# Patient Record
Sex: Female | Born: 1995 | Race: Black or African American | Hispanic: No | Smoking: Never smoker
Health system: Southern US, Community
[De-identification: ages and names within clinical notes are randomized; demographics above are authoritative.]

## PROBLEM LIST (undated history)

## (undated) DIAGNOSIS — J302 Other seasonal allergic rhinitis: Secondary | ICD-10-CM

## (undated) DIAGNOSIS — A6000 Herpesviral infection of urogenital system, unspecified: Secondary | ICD-10-CM

## (undated) DIAGNOSIS — F32A Depression, unspecified: Secondary | ICD-10-CM

## (undated) DIAGNOSIS — F329 Major depressive disorder, single episode, unspecified: Secondary | ICD-10-CM

## (undated) DIAGNOSIS — F419 Anxiety disorder, unspecified: Secondary | ICD-10-CM

## (undated) DIAGNOSIS — J45909 Unspecified asthma, uncomplicated: Secondary | ICD-10-CM

## (undated) HISTORY — DX: Other seasonal allergic rhinitis: J30.2

## (undated) HISTORY — DX: Depression, unspecified: F32.A

## (undated) HISTORY — DX: Unspecified asthma, uncomplicated: J45.909

## (undated) HISTORY — DX: Anxiety disorder, unspecified: F41.9

## (undated) HISTORY — DX: Major depressive disorder, single episode, unspecified: F32.9

## (undated) HISTORY — DX: Herpesviral infection of urogenital system, unspecified: A60.00

---
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
## 2017-11-23 ENCOUNTER — Ambulatory Visit (INDEPENDENT_AMBULATORY_CARE_PROVIDER_SITE_OTHER): Payer: Medicaid Other | Admitting: Certified Nurse Midwife

## 2017-11-23 ENCOUNTER — Encounter: Payer: Self-pay | Admitting: Certified Nurse Midwife

## 2017-11-23 VITALS — BP 107/65 | HR 81 | Ht 61.0 in | Wt 118.1 lb

## 2017-11-23 DIAGNOSIS — Z3009 Encounter for other general counseling and advice on contraception: Secondary | ICD-10-CM

## 2017-11-23 DIAGNOSIS — Z708 Other sex counseling: Secondary | ICD-10-CM

## 2017-11-23 NOTE — Progress Notes (Signed)
GYN ENCOUNTER NOTE  Subjective:       Selena Rogers is a 22 y.o. G0P0000 female is here for gynecologic evaluation of the following issues:  1. Follow up for HSV. She states that she was seen jun/july of 2018 for increased vaginal discharge and was treated for BV. In October she was seen for painful vaginal lesions that she found out was HSV.  She was treated with Acyclovir. The lesions went a way after treatment and have not returned. Her mother wanted her to be seen for the HSV to "have it taken care of."   Gynecologic History Patient's last menstrual period was 11/05/2017. Contraception: none Last Pap: N/A  Obstetric History OB History  Gravida Para Term Preterm AB Living  0 0 0 0 0 0  SAB TAB Ectopic Multiple Live Births  0 0 0 0 0        Past Medical History:  Diagnosis Date  . Anxiety   . Asthma   . Depression   . Genital herpes   . Seasonal allergies     History reviewed. No pertinent surgical history.  Current Outpatient Medications on File Prior to Visit  Medication Sig Dispense Refill  . acyclovir (ZOVIRAX) 200 MG capsule Take 200 mg by mouth 2 (two) times daily.    . beclomethasone (QVAR) 40 MCG/ACT inhaler Inhale into the lungs 2 (two) times daily.    Marland Kitchen escitalopram (LEXAPRO) 10 MG tablet Take 10 mg by mouth daily.    . montelukast (SINGULAIR) 10 MG tablet Take 10 mg by mouth at bedtime.     No current facility-administered medications on file prior to visit.     Allergies  Allergen Reactions  . Clindamycin/Lincomycin   . Zithromax [Azithromycin]     Social History   Socioeconomic History  . Marital status: Single    Spouse name: Not on file  . Number of children: Not on file  . Years of education: Not on file  . Highest education level: Not on file  Social Needs  . Financial resource strain: Not on file  . Food insecurity - worry: Not on file  . Food insecurity - inability: Not on file  . Transportation needs - medical: Not on file  .  Transportation needs - non-medical: Not on file  Occupational History  . Not on file  Tobacco Use  . Smoking status: Never Smoker  . Smokeless tobacco: Never Used  Substance and Sexual Activity  . Alcohol use: No    Frequency: Never  . Drug use: No  . Sexual activity: Not Currently  Other Topics Concern  . Not on file  Social History Narrative  . Not on file    History reviewed. No pertinent family history.  The following portions of the patient's history were reviewed and updated as appropriate: allergies, current medications, past family history, past medical history, past social history, past surgical history and problem list.  Review of Systems Review of Systems - Negative except as mentioned in HPI Review of Systems - General ROS: negative for - chills, fatigue, fever, hot flashes, malaise or night sweats Hematological and Lymphatic ROS: negative for - bleeding problems or swollen lymph nodes Gastrointestinal ROS: negative for - abdominal pain, blood in stools, change in bowel habits and nausea/vomiting Musculoskeletal ROS: negative for - joint pain, muscle pain or muscular weakness Genito-Urinary ROS: negative for - change in menstrual cycle, dysmenorrhea, dyspareunia, dysuria, genital discharge, genital ulcers, hematuria, incontinence, irregular/heavy menses, nocturia or pelvic painjj  Objective:   BP 107/65   Pulse 81   Ht 5\' 1"  (1.549 m)   Wt 118 lb 1.6 oz (53.6 kg)   LMP 11/05/2017   BMI 22.31 kg/m  CONSTITUTIONAL: Well-developed, well-nourished female in no acute distress.  HENT:  Normocephalic, atraumatic.  NECK: Normal range of motion,   SKIN: Skin is warm and dry. No rash noted. Not diaphoretic. No erythema. No pallor. NEUROLGIC: Alert and oriented to person, place, and time.  PSYCHIATRIC: Normal mood and affect. Normal behavior. Normal judgment and thought content. CARDIOVASCULAR:Not Examined RESPIRATORY: Not Examined BREASTS: Not Examined PELVIC: Not  indicated.  MUSCULOSKELETAL: Normal range of motion. No tenderness.  No cyanosis, clubbing, or edema.  Assessment:   STD Education  Contraceptive counseling    Plan:   Reviewed management of HSV, discussed HSV 1 & HSV 2 virus and explained that she will always carry the visurs. Explained that she may have recurrent outbreaks. Reviewed options of valtrex for episodic issue vs. Daily suppressive therapy.  She verbalizes understanding. She has a prescription for acyclovir and agrees to continue with episoidic treatment given she has not had another outbreak since her initial one. She is currently not on birth control Reviewed birth control options. Pamphlet given on IUD.  Encouraged use of condom to prevent spread of HSV and prevent getting additional STD's. She verbalizes understanding and agrees to plan. Follow up PRN.  I attest more than 50% of visit spent discussing HSV dx, and management plan. Discussed birth control options including : patch/pill/ring/iud/implant. Discuss safe sex practices. Face to Face time 10 minutes.   Doreene BurkeAnnie Kiernan Atkerson, CNM

## 2017-11-23 NOTE — Patient Instructions (Signed)

## 2018-02-12 DIAGNOSIS — J3089 Other allergic rhinitis: Secondary | ICD-10-CM | POA: Insufficient documentation

## 2018-02-12 DIAGNOSIS — J4520 Mild intermittent asthma, uncomplicated: Secondary | ICD-10-CM | POA: Insufficient documentation

## 2018-02-12 DIAGNOSIS — F418 Other specified anxiety disorders: Secondary | ICD-10-CM | POA: Insufficient documentation

## 2018-07-18 ENCOUNTER — Emergency Department
Admission: EM | Admit: 2018-07-18 | Discharge: 2018-07-18 | Disposition: A | Discharge status: Home / Self Care | Payer: Medicaid Other | Attending: Emergency Medicine | Admitting: Emergency Medicine

## 2018-07-18 ENCOUNTER — Emergency Department: Payer: Medicaid Other

## 2018-07-18 ENCOUNTER — Other Ambulatory Visit: Payer: Self-pay

## 2018-07-18 ENCOUNTER — Encounter: Payer: Self-pay | Admitting: *Deleted

## 2018-07-18 DIAGNOSIS — B9689 Other specified bacterial agents as the cause of diseases classified elsewhere: Secondary | ICD-10-CM | POA: Diagnosis not present

## 2018-07-18 DIAGNOSIS — J45909 Unspecified asthma, uncomplicated: Secondary | ICD-10-CM | POA: Diagnosis not present

## 2018-07-18 DIAGNOSIS — N926 Irregular menstruation, unspecified: Secondary | ICD-10-CM | POA: Insufficient documentation

## 2018-07-18 DIAGNOSIS — Z3202 Encounter for pregnancy test, result negative: Secondary | ICD-10-CM | POA: Diagnosis not present

## 2018-07-18 DIAGNOSIS — Z79899 Other long term (current) drug therapy: Secondary | ICD-10-CM | POA: Insufficient documentation

## 2018-07-18 DIAGNOSIS — N760 Acute vaginitis: Secondary | ICD-10-CM | POA: Insufficient documentation

## 2018-07-18 DIAGNOSIS — N938 Other specified abnormal uterine and vaginal bleeding: Secondary | ICD-10-CM | POA: Diagnosis present

## 2018-07-18 LAB — URINALYSIS, COMPLETE (UACMP) WITH MICROSCOPIC
BILIRUBIN URINE: NEGATIVE
Bacteria, UA: NONE SEEN
GLUCOSE, UA: NEGATIVE mg/dL
KETONES UR: NEGATIVE mg/dL
LEUKOCYTES UA: NEGATIVE
NITRITE: NEGATIVE
PH: 5 (ref 5.0–8.0)
Protein, ur: NEGATIVE mg/dL
Specific Gravity, Urine: 1.015 (ref 1.005–1.030)
Squamous Epithelial / LPF: NONE SEEN (ref 0–5)

## 2018-07-18 LAB — POCT PREGNANCY, URINE: PREG TEST UR: NEGATIVE

## 2018-07-18 LAB — WET PREP, GENITAL
Sperm: NONE SEEN
Trich, Wet Prep: NONE SEEN
YEAST WET PREP: NONE SEEN

## 2018-07-18 LAB — CHLAMYDIA/NGC RT PCR (ARMC ONLY)
Chlamydia Tr: NOT DETECTED
N GONORRHOEAE: NOT DETECTED

## 2018-07-18 MED ORDER — IBUPROFEN 400 MG PO TABS
600.0000 mg | ORAL_TABLET | ORAL | Status: AC
  Administered 2018-07-18: 600 mg via ORAL
  Filled 2018-07-18: qty 2

## 2018-07-18 MED ORDER — METRONIDAZOLE 500 MG PO TABS: 500.0000 mg | ORAL_TABLET | Freq: Three times a day (TID) | ORAL | 0 refills | Status: DC

## 2018-07-18 NOTE — ED Notes (Signed)
Upon assessment pt reports light bleeding since yesterday.Pt also reports mild cramping.

## 2018-07-18 NOTE — Discharge Instructions (Signed)
Please follow up closely with obstetrics and gynecology or your primary doctor.  Return to the emergency room if your bleeding worsens, you become weak and dizzy or lightheaded, you have an episode of passing out, develop severe bleeding such as more than 1 soaked pad per hour for more than 3 straight hours, develop abdominal or pelvic pain, fevers chills or other new concerns arise.   

## 2018-07-18 NOTE — ED Provider Notes (Signed)
[facility] Emergency Department Provider Note   ____________________________________________   First MD Initiated Contact with Patient 07/18/18 1914     (approximate)  I have reviewed the triage vital signs and the nursing notes.   HISTORY  Chief Complaint Vaginal Bleeding    HPI Selena Rogers is a 22 y.o. female evaluation for light bleeding since yesterday and some mild lower abdominal cramping  Patient reports her last menstrual cycle was about August 15 and she is not due for another cycle until roughly September 15, 3 days from now.  She reports she is usually very regular.  Today she began experiencing slight cramping discomfort in her lower abdomen that feels similar to her menstrual cycle starting, and also scant amount of bleeding.  No heavy bleeding.  No nausea vomiting.  Does not believe she is pregnant but reports there is a slight chance.  Bleeding is light using 1-2 pads.  No other symptoms.  No fevers or chills.  No discharge other than slight bleeding.  Currently reports very mild discomfort crampy in nature involving the lower abdomen.  Offered to speak with the patient in private as her boyfriend accompanied her to the room, however patient reports she is fine giving detailed history with boyfriend present.  Past Medical History:  Diagnosis Date  . Anxiety   . Asthma   . Depression   . Genital herpes   . Seasonal allergies     There are no active problems to display for this patient.   History reviewed. No pertinent surgical history.  Prior to Admission medications   Medication Sig Start Date End Date Taking? Authorizing Provider  acyclovir (ZOVIRAX) 200 MG capsule Take 200 mg by mouth 2 (two) times daily.    [provider]  beclomethasone (QVAR) 40 MCG/ACT inhaler Inhale into the lungs 2 (two) times daily.    [provider]  escitalopram (LEXAPRO) 10 MG tablet Take 10 mg by mouth daily.    [provider]  metroNIDAZOLE (FLAGYL) 500 MG tablet Take 1 tablet (500 mg total) by mouth 3 (three) times daily. 07/18/18   Sharyn CreamerQuale, Gwyneth Fernandez, MD  montelukast (SINGULAIR) 10 MG tablet Take 10 mg by mouth at bedtime.    [provider]    Allergies Clindamycin/lincomycin and Zithromax [azithromycin]  History reviewed. No pertinent family history.  Social History Social History   Tobacco Use  . Smoking status: Never Smoker  . Smokeless tobacco: Never Used  Substance Use Topics  . Alcohol use: No    Frequency: Never  . Drug use: No    Review of Systems Constitutional: No fever/chills Eyes: No visual changes. ENT: No sore throat. Cardiovascular: Denies chest pain. Respiratory: Denies shortness of breath. Gastrointestinal: No abdominal pain except some slight crampy pain which she describes as in her lower abdominal area.  No nausea, no vomiting.  No diarrhea.  No constipation. Genitourinary: Negative for dysuria. Musculoskeletal: Negative for back pain. Skin: Negative for rash. Neurological: Negative for headaches, focal weakness or numbness.    ____________________________________________   PHYSICAL EXAM:  VITAL SIGNS: ED Triage Vitals [07/18/18 1829]  Enc Vitals Group     BP 125/88     Pulse Rate 81     Resp 16     Temp 99.3 F (37.4 C)     Temp Source Oral     SpO2 99 %     Weight 125 lb (56.7 kg)     Height 5' 1.5" (1.562 m)  Head Circumference      Peak Flow      Pain Score 6     Pain Loc      Pain Edu?      Excl. in GC?     Constitutional: Alert and oriented. Well appearing and in no acute distress. Eyes: Conjunctivae are normal. Head: Atraumatic. Nose: No congestion/rhinnorhea. Mouth/Throat: Mucous membranes are moist. Neck: No stridor.   Cardiovascular: Normal rate, regular rhythm. Grossly normal heart sounds.  Good peripheral circulation. Respiratory: Normal respiratory effort.  No retractions. Lungs CTAB. Gastrointestinal: Soft and  nontender. No distention.  No peritonitis.  No tenderness to examination of the lower abdomen including right lower quadrant and suprapubic regions.  No peritonitis in any quadrant.  No left lower quadrant pain. Gynecologic: Performed with tech Maralyn Sago.  External exam normal.  Internal exam nontender, no abnormal lesions noted.  No cervical motion tenderness.  There is some scant amount of blood present at the office in the posterior area of the vault.  No heavy bleeding. Musculoskeletal: No lower extremity tenderness nor edema. Neurologic:  Normal speech and language. No gross focal neurologic deficits are appreciated.  Skin:  Skin is warm, dry and intact. No rash noted. Psychiatric: Mood and affect are normal. Speech and behavior are normal.  ____________________________________________   LABS (all labs ordered are listed, but only abnormal results are displayed)  Labs Reviewed  WET PREP, GENITAL - Abnormal; Notable for the following components:      Result Value   Clue Cells Wet Prep HPF POC PRESENT (*)    WBC, Wet Prep HPF POC RARE (*)    All other components within normal limits  CHLAMYDIA/NGC RT PCR ([facility] ONLY)  URINALYSIS, COMPLETE (UACMP) WITH MICROSCOPIC  POC URINE PREG, ED  POCT PREGNANCY, URINE   ____________________________________________  EKG   ____________________________________________  RADIOLOGY  US Transvaginal Non-ob  Result Date: 07/18/2018 CLINICAL DATA:  Vaginal bleeding with low abdominal pain EXAM: TRANSABDOMINAL AND TRANSVAGINAL ULTRASOUND OF PELVIS DOPPLER ULTRASOUND OF OVARIES TECHNIQUE: Both transabdominal and transvaginal ultrasound examinations of the pelvis were performed. Transabdominal technique was performed for global imaging of the pelvis including uterus, ovaries, adnexal regions, and pelvic cul-de-sac. It was necessary to proceed with endovaginal exam following the transabdominal exam to visualize the endometrium and ovaries. Color and  duplex Doppler ultrasound was utilized to evaluate blood flow to the ovaries. COMPARISON:  None. FINDINGS: Uterus Measurements: 6.3 x 3.8 x 4.4 cm. No fibroids or other mass visualized. Endometrium Thickness: 11.7 mm.  No focal abnormality visualized. Right ovary Measurements: 3.2 x 1.2 x 2.1 cm. Normal appearance/no adnexal mass. Left ovary Measurements: 3.1 x 1.5 x 1.9 cm. Normal appearance/no adnexal mass. Pulsed Doppler evaluation of both ovaries demonstrates normal low-resistance arterial and venous waveforms. Other findings No abnormal free fluid. IMPRESSION: Negative pelvic ultrasound.  No evidence for ovarian torsion Endometrial thickness of 11.7 mm. If bleeding remains unresponsive to hormonal or medical therapy, sonohysterogram should be considered for focal lesion work-up. (Ref: Radiological Reasoning: Algorithmic Workup of Abnormal Vaginal Bleeding with Endovaginal Sonography and Sonohysterography. AJR 2008; [id number]:A54-09) Electronically Signed   By: Jasmine Pang M.D.   On: 07/18/2018 21:23   US Pelvis Complete  Result Date: 07/18/2018 CLINICAL DATA:  Vaginal bleeding with low abdominal pain EXAM: TRANSABDOMINAL AND TRANSVAGINAL ULTRASOUND OF PELVIS DOPPLER ULTRASOUND OF OVARIES TECHNIQUE: Both transabdominal and transvaginal ultrasound examinations of the pelvis were performed. Transabdominal technique was performed for global imaging of the pelvis including uterus, ovaries, adnexal regions, and  pelvic cul-de-sac. It was necessary to proceed with endovaginal exam following the transabdominal exam to visualize the endometrium and ovaries. Color and duplex Doppler ultrasound was utilized to evaluate blood flow to the ovaries. COMPARISON:  None. FINDINGS: Uterus Measurements: 6.3 x 3.8 x 4.4 cm. No fibroids or other mass visualized. Endometrium Thickness: 11.7 mm.  No focal abnormality visualized. Right ovary Measurements: 3.2 x 1.2 x 2.1 cm. Normal appearance/no adnexal mass. Left ovary Measurements:  3.1 x 1.5 x 1.9 cm. Normal appearance/no adnexal mass. Pulsed Doppler evaluation of both ovaries demonstrates normal low-resistance arterial and venous waveforms. Other findings No abnormal free fluid. IMPRESSION: Negative pelvic ultrasound.  No evidence for ovarian torsion Endometrial thickness of 11.7 mm. If bleeding remains unresponsive to hormonal or medical therapy, sonohysterogram should be considered for focal lesion work-up. (Ref: Radiological Reasoning: Algorithmic Workup of Abnormal Vaginal Bleeding with Endovaginal Sonography and Sonohysterography. AJR 2008; 161:W96-04) Electronically Signed   By: Jasmine Pang M.D.   On: 07/18/2018 21:23   [place] Art/ven Flow Abd Pelv Doppler  Result Date: 07/18/2018 CLINICAL DATA:  Vaginal bleeding with low abdominal pain EXAM: TRANSABDOMINAL AND TRANSVAGINAL ULTRASOUND OF PELVIS DOPPLER ULTRASOUND OF OVARIES TECHNIQUE: Both transabdominal and transvaginal ultrasound examinations of the pelvis were performed. Transabdominal technique was performed for global imaging of the pelvis including uterus, ovaries, adnexal regions, and pelvic cul-de-sac. It was necessary to proceed with endovaginal exam following the transabdominal exam to visualize the endometrium and ovaries. Color and duplex Doppler ultrasound was utilized to evaluate blood flow to the ovaries. COMPARISON:  None. FINDINGS: Uterus Measurements: 6.3 x 3.8 x 4.4 cm. No fibroids or other mass visualized. Endometrium Thickness: 11.7 mm.  No focal abnormality visualized. Right ovary Measurements: 3.2 x 1.2 x 2.1 cm. Normal appearance/no adnexal mass. Left ovary Measurements: 3.1 x 1.5 x 1.9 cm. Normal appearance/no adnexal mass. Pulsed Doppler evaluation of both ovaries demonstrates normal low-resistance arterial and venous waveforms. Other findings No abnormal free fluid. IMPRESSION: Negative pelvic ultrasound.  No evidence for ovarian torsion Endometrial thickness of 11.7 mm. If bleeding remains unresponsive to  hormonal or medical therapy, sonohysterogram should be considered for focal lesion work-up. (Ref: Radiological Reasoning: Algorithmic Workup of Abnormal Vaginal Bleeding with Endovaginal Sonography and Sonohysterography. AJR 2008; 540:J81-19) Electronically Signed   By: Jasmine Pang M.D.   On: 07/18/2018 21:23    ____________________________________________   PROCEDURES  Procedure(s) performed: None  Procedures  Critical Care performed: No  ____________________________________________   INITIAL IMPRESSION / ASSESSMENT AND PLAN / ED COURSE  Pertinent labs & imaging results that were available during my care of the patient were reviewed by me and considered in my medical decision making (see chart for details).  Vaginal bleeding, slight crampy discomfort.  Clinical exam very reassuring with normal vital signs and no evidence of acute intra-abdominal discomfort or pain on exam.  She reports this is unusual given its 3 days earlier than expected.  Pregnancy test negative.  Pelvic exam reassuring with just some very slight amount of blood.  No heavy bleeding only using 1-2 pads today.  Will obtain ultrasound to evaluate for etiology, suspect likely slightly early onset of her menses based on my clinical history, would also rule out potential cyst, infection etc.  No signs or symptoms of appendicitis or acute intra-abdominal etiology.    ----------------------------------------- 9:41 PM on 07/18/2018 -----------------------------------------  Resting comfortably.  I suspect her menses may have potentially just started early.  Reassuring results.  Resting comfortably with normal vital signs.  Discussed  with patient, will treat for bacterial vaginosis.  Discussed common side effects of Flagyl with the patient as well.  Return precautions and treatment recommendations and follow-up discussed with the patient who is agreeable with the  plan.   ____________________________________________   FINAL CLINICAL IMPRESSION(S) / ED DIAGNOSES  Final diagnoses:  BV (bacterial vaginosis)  Irregular menstruation      NEW MEDICATIONS STARTED DURING THIS VISIT:  New Prescriptions   METRONIDAZOLE (FLAGYL) 500 MG TABLET    Take 1 tablet (500 mg total) by mouth 3 (three) times daily.     Note:  This document was prepared using Dragon voice recognition software and may include unintentional dictation errors.     Sharyn Creamer, MD 07/18/18 2142

## 2018-07-18 NOTE — ED Triage Notes (Signed)
Pt to ED reporting vaginal bleeding for the past two days with cramping. Pt reports she is not supposed to start her period until the 15th of this month. Pt denies needing to use tampons of pads and reports this is abnormal because her period usually starts heavy. No clots. No odor and no vaginal irritation.   Pt reports there is a chance she is pregnant.

## 2018-09-28 ENCOUNTER — Other Ambulatory Visit: Payer: Self-pay

## 2018-09-28 ENCOUNTER — Emergency Department: Payer: Medicaid Other

## 2018-09-28 ENCOUNTER — Emergency Department
Admission: EM | Admit: 2018-09-28 | Discharge: 2018-09-28 | Disposition: A | Discharge status: Home / Self Care | Payer: Medicaid Other | Attending: Emergency Medicine | Admitting: Emergency Medicine

## 2018-09-28 DIAGNOSIS — J45909 Unspecified asthma, uncomplicated: Secondary | ICD-10-CM | POA: Diagnosis not present

## 2018-09-28 DIAGNOSIS — Z79899 Other long term (current) drug therapy: Secondary | ICD-10-CM | POA: Diagnosis not present

## 2018-09-28 DIAGNOSIS — R1030 Lower abdominal pain, unspecified: Secondary | ICD-10-CM | POA: Diagnosis present

## 2018-09-28 LAB — URINALYSIS, ROUTINE W REFLEX MICROSCOPIC
Bilirubin Urine: NEGATIVE
GLUCOSE, UA: NEGATIVE mg/dL
HGB URINE DIPSTICK: NEGATIVE
Ketones, ur: NEGATIVE mg/dL
Leukocytes, UA: NEGATIVE
Nitrite: NEGATIVE
Protein, ur: NEGATIVE mg/dL
SPECIFIC GRAVITY, URINE: 1.018 (ref 1.005–1.030)
pH: 8 (ref 5.0–8.0)

## 2018-09-28 LAB — COMPREHENSIVE METABOLIC PANEL
ALK PHOS: 60 U/L (ref 38–126)
ALT: 12 U/L (ref 0–44)
ANION GAP: 8 (ref 5–15)
AST: 22 U/L (ref 15–41)
Albumin: 4.5 g/dL (ref 3.5–5.0)
BILIRUBIN TOTAL: 0.7 mg/dL (ref 0.3–1.2)
BUN: 10 mg/dL (ref 6–20)
CALCIUM: 8.8 mg/dL — AB (ref 8.9–10.3)
CO2: 24 mmol/L (ref 22–32)
Chloride: 108 mmol/L (ref 98–111)
Creatinine, Ser: 0.63 mg/dL (ref 0.44–1.00)
Glucose, Bld: 70 mg/dL (ref 70–99)
Potassium: 3.4 mmol/L — ABNORMAL LOW (ref 3.5–5.1)
Sodium: 140 mmol/L (ref 135–145)
TOTAL PROTEIN: 7.7 g/dL (ref 6.5–8.1)

## 2018-09-28 LAB — CBC
HCT: 43.5 % (ref 36.0–46.0)
Hemoglobin: 14 g/dL (ref 12.0–15.0)
MCH: 28.6 pg (ref 26.0–34.0)
MCHC: 32.2 g/dL (ref 30.0–36.0)
MCV: 89 fL (ref 80.0–100.0)
NRBC: 0 % (ref 0.0–0.2)
Platelets: 315 10*3/uL (ref 150–400)
RBC: 4.89 MIL/uL (ref 3.87–5.11)
RDW: 13.2 % (ref 11.5–15.5)
WBC: 8.7 10*3/uL (ref 4.0–10.5)

## 2018-09-28 LAB — POCT PREGNANCY, URINE: Preg Test, Ur: NEGATIVE

## 2018-09-28 LAB — LIPASE, BLOOD: Lipase: 29 U/L (ref 11–51)

## 2018-09-28 MED ORDER — IOPAMIDOL (ISOVUE-300) INJECTION 61%
30.0000 mL | Freq: Once | INTRAVENOUS | Status: AC | PRN
  Administered 2018-09-28: 30 mL via ORAL

## 2018-09-28 NOTE — ED Triage Notes (Signed)
Lower back pain to right flank and right lower abdomen X 3 days. Pain worse with walking. Pt alert and oriented X4, active, cooperative, pt in NAD. RR even and unlabored, color WNL.

## 2018-09-28 NOTE — ED Notes (Signed)
C/o of pain to RLQ Radiates int back, denies flank pain with palpation. Urine preg NEG, Urine specimen sent from triage. Labs resulted. Patient awaiting Md eval

## 2018-09-28 NOTE — ED Notes (Signed)
Dr Juliette AlcideMelinda at bedside. Patient awaiting Ct abd.

## 2018-09-28 NOTE — ED Notes (Signed)
Informed Dr. Darnelle CatalanMalinda, IV team unable to obtain IV access, per Dr. Darnelle CatalanMalinda, ok to scan pt without IV contrast. CT notified.

## 2018-09-28 NOTE — ED Notes (Addendum)
Lab notified urine has not resulted yet. Spoke with Nadine CountsBob states he cannot see an order for urine, new order placed at this time.

## 2018-09-28 NOTE — Discharge Instructions (Addendum)
CT scan says there is a moderate amount of stool in the colon.  You may have a little constipation.  You might want to try an over-the-counter laxative.  Otherwise return for worse pain fever vomiting or feeling sicker.  Try Advil or Tylenol for pain.

## 2018-09-28 NOTE — ED Notes (Signed)
IV nurse at bedside.

## 2018-09-28 NOTE — ED Notes (Signed)
RN Demetrio LappingKala attempted IV access x 2 unsuccessful, IV consult placed.

## 2018-09-28 NOTE — ED Notes (Signed)
Introduced myself to patient, several family and friends at bedside, apologized to patient for the delay, informed her the IV nurse would be coming in to start IV for CT scan. Pt completed oral contrast without problems. Pt given warm blanket. Denies any needs at this time.

## 2018-09-28 NOTE — ED Notes (Signed)
Informed pt that CT would be coming to get pt soon and they will be doing the CT without contrast dye in the IV.

## 2018-09-28 NOTE — ED Provider Notes (Signed)
[facility] Emergency Department Provider Note   ____________________________________________   First MD Initiated Contact with Patient 09/28/18 1644     (approximate)  I have reviewed the triage vital signs and the nursing notes.   HISTORY  Chief Complaint Abdominal Pain and Flank Pain   HPI Selena Rogers is a 22 y.o. female she complains of 3 days of pain in the right lower abdomen.  Is worse with walking.  On exam the pain seems to be in the groin crease between the abdomen and the leg actually outside of the abdominal cavity.  It is worse with palpation in that area as well.  I do not feel any masses or anything there.  Has no fever nausea vomiting or any other complaints.  Pain is moderate.   Past Medical History:  Diagnosis Date  . Anxiety   . Asthma   . Depression   . Genital herpes   . Seasonal allergies     There are no active problems to display for this patient.   History reviewed. No pertinent surgical history.  Prior to Admission medications   Medication Sig Start Date End Date Taking? Authorizing Provider  acyclovir (ZOVIRAX) 200 MG capsule Take 200 mg by mouth 2 (two) times daily.    [provider]  beclomethasone (QVAR) 40 MCG/ACT inhaler Inhale into the lungs 2 (two) times daily.    [provider]  escitalopram (LEXAPRO) 10 MG tablet Take 10 mg by mouth daily.    [provider]  metroNIDAZOLE (FLAGYL) 500 MG tablet Take 1 tablet (500 mg total) by mouth 3 (three) times daily. 07/18/18   Sharyn CreamerQuale, Mark, MD  montelukast (SINGULAIR) 10 MG tablet Take 10 mg by mouth at bedtime.    [provider]    Allergies Clindamycin/lincomycin and Zithromax [azithromycin]  No family history on file.  Social History Social History   Tobacco Use  . Smoking status: Never Smoker  . Smokeless tobacco: Never Used  Substance Use Topics  . Alcohol use: No    Frequency: Never  . Drug use: No     Review of Systems  Constitutional: No fever/chills Eyes: No visual changes. ENT: No sore throat. Cardiovascular: Denies chest pain. Respiratory: Denies shortness of breath. Gastrointestinal: See HPI there is no nausea or vomiting or diarrhea Genitourinary: Negative for dysuria. Musculoskeletal: Negative for back pain. Skin: Negative for rash. Neurological: Negative for headaches, focal weakness   ____________________________________________   PHYSICAL EXAM:  VITAL SIGNS: ED Triage Vitals  Enc Vitals Group     BP 09/28/18 1529 119/73     Pulse Rate 09/28/18 1529 84     Resp 09/28/18 1529 12     Temp 09/28/18 1529 98.6 F (37 C)     Temp Source 09/28/18 1529 Oral     SpO2 09/28/18 1529 100 %     Weight 09/28/18 1528 125 lb (56.7 kg)     Height 09/28/18 1528 5\' 1"  (1.549 m)     Head Circumference --      Peak Flow --      Pain Score 09/28/18 1539 6     Pain Loc --      Pain Edu? --      Excl. in GC? --     Constitutional: Alert and oriented. Well appearing and in no acute distress. Eyes: Conjunctivae are normal.  Head: Atraumatic. Nose: No congestion/rhinnorhea. Mouth/Throat: Mucous membranes are moist.  Oropharynx non-erythematous. Neck: No stridor.  Cardiovascular: Normal rate,  regular rhythm. Grossly normal heart sounds.  Good peripheral circulation. Respiratory: Normal respiratory effort.  No retractions. Lungs CTAB. Gastrointestinal: Soft and nontender except for as described in HPI. No distention. No abdominal bruits. No CVA tenderness.  Is absolutely no suprapubic tenderness to deep palpation Musculoskeletal: No lower extremity tenderness nor edema.  No joint effusions. Neurologic:  Normal speech and language. No gross focal neurologic deficits are appreciated.  Skin:  Skin is warm, dry and intact. No rash noted. Psychiatric: Mood and affect are normal. Speech and behavior are normal.  ____________________________________________   LABS (all labs  ordered are listed, but only abnormal results are displayed)  Labs Reviewed  COMPREHENSIVE METABOLIC PANEL - Abnormal; Notable for the following components:      Result Value   Potassium 3.4 (*)    Calcium 8.8 (*)    All other components within normal limits  URINALYSIS, ROUTINE W REFLEX MICROSCOPIC - Abnormal; Notable for the following components:   Color, Urine YELLOW (*)    APPearance CLEAR (*)    All other components within normal limits  LIPASE, BLOOD  CBC  URINALYSIS, COMPLETE (UACMP) WITH MICROSCOPIC  POCT PREGNANCY, URINE  POC URINE PREG, ED   ____________________________________________  EKG   ____________________________________________  RADIOLOGY  ED MD interpretation: CT only shows possible constipation  Official radiology report(s): Ct Abdomen Pelvis Wo Contrast  Result Date: 09/28/2018 CLINICAL DATA:  Low back pain to RIGHT flank pain, RIGHT lower abdominal pain, for 3 days. EXAM: CT ABDOMEN AND PELVIS WITHOUT CONTRAST TECHNIQUE: Multidetector CT imaging of the abdomen and pelvis was performed following the standard protocol without IV contrast. COMPARISON:  None. FINDINGS: Lower chest: No acute abnormality. Hepatobiliary: No focal liver abnormality is seen. No gallstones, gallbladder wall thickening, or biliary dilatation. Pancreas: Poorly seen due to the abutting bowel loops and vascular structures. No peripancreatic fluid or inflammation. Spleen: Normal in size without focal abnormality. Adrenals/Urinary Tract: Adrenal glands are unremarkable. Kidneys appear normal without mass, stone or hydronephrosis. No perinephric fluid. No ureteral or bladder calculi identified. Bladder is unremarkable, partially decompressed. Stomach/Bowel: No bowel obstruction or convincing evidence of bowel wall inflammation. Moderate amount of stool and gas within the rectosigmoid colon. Appendix is not convincingly seen but there are no inflammatory changes about the cecum to suggest acute  appendicitis. Vascular/Lymphatic: No significant vascular findings are present. No enlarged abdominal or pelvic lymph nodes. Reproductive: Uterus and bilateral adnexa are unremarkable. Other: No free fluid or abscess collection. No free intraperitoneal air. Musculoskeletal: No acute or significant osseous findings. IMPRESSION: 1. No acute findings within the abdomen or pelvis. No renal or ureteral calculi. No bowel obstruction or evidence of bowel wall inflammation. No free fluid. Appendix is not convincingly seen but there are no inflammatory changes about the cecum to suggest acute appendicitis. 2. Moderate amount of gas and stool in the rectosigmoid colon (constipation?). Electronically Signed   By: Bary Richard M.D.   On: 09/28/2018 22:00    ____________________________________________   PROCEDURES  Procedure(s) performed:   Procedures  Critical Care performed:   ____________________________________________   INITIAL IMPRESSION / ASSESSMENT AND PLAN / ED COURSE  We will give the patient diagnosis of abdominal pain although the pain actually is pain in the right groin.  There is no sign of any swollen nodes aneurysms hernias or anything else.  It is possible she pulled a muscle.  It is possible perhaps the pain is referred from constipation but I doubt it.      ____________________________________________  FINAL CLINICAL IMPRESSION(S) / ED DIAGNOSES  Final diagnoses:  Lower abdominal pain     ED Discharge Orders    None       Note:  This document was prepared using Dragon voice recognition software and may include unintentional dictation errors.    Arnaldo Natal, MD 09/28/18 2207

## 2018-11-06 NOTE — L&D Delivery Note (Signed)
Vacuum: Called for evaluation of patient in second stage of labor.  Evaluation revealed fetal vertex on the perineum adequate pelvic room.  Patient with very poor maternal expulsive effort.  Contractions by IUPC inadequate.  Attempt to stimulate uterine contractions with Pitocin ineffective.  After [facility] CNM discussed vacuum extraction versus decreasing epidural patient chose vacuum extraction. The Silastic cup vacuum was placed in the usual manner on the fetal vertex and over the next several contractions traction was applied while the patient pushed.  Maternal expulsive effort remained poor.  With each contraction and traction on the fetal vertex advancement was noted as the head slowly descended.  At the outlet with the head and entire vacuum outside of the vagina 3 pop offs in a row occurred. The pelvis was again evaluated and soft tissue was noted to be present possibly inhibiting delivery.  Local infiltration with lidocaine was performed in the midline episiotomy was cut.  Patient was strongly encouraged to push and over the next few contractions she moved the baby and the head delivered.  Traction was continued to be applied a tight nuchal cord was noted and reduced.  With some manipulation the shoulders delivered followed by the body.  Baby was noted to have decreased tone.  The umbilical cord was doubly clamped and cut.  Cord blood gases were obtained.  The infant was handed to the the neonatologist and received a nurse who provided resuscitative measures at the warmer. The placenta delivered without incident and was noted to be complete with a three-vessel cord. Evaluation of the episiotomy was performed and 1/3 degree extension was noted.  The sphincter was carefully identified and using a figure-of-eight suture approximated in the midline.  The rectal mucosa was examined with 1 finger in the rectum and was noted to be completely intact. Using local anesthesia the remainder of the episiotomy was  repaired in the normal manner.  The patient tolerated this well.  The uterus was noted to be firm.  All sponge and instrument counts were correct. Please see [facility] CNM dictation for remainder of delivery information.

## 2018-11-06 NOTE — L&D Delivery Note (Addendum)
Delivery Note  1156 In room to see patient, reports pelvic pressure with the urge to push. SVE: 10/100/0-+1, vertex. Poor maternal pushing efforts noted in various positions utilizing CNM and RN coaching.   1315 Discussion with patient and mother regarding next steps and fetal heart rate changes and second stage progress. Advised continued pushing efforts with decreased epidural anesthesia, vacuum assisted birth or cesarean section at this time; patient a mother agreeable to further assessment by Dr. Amalia Hailey.   1330 Dr. Amalia Hailey in room to assess second stage progress. Patient agreeable to vacuum assisted birth with the understanding that maternal pushing efforts will still be required. Please see Dr. Amalia Hailey note for episiotomy and vacuum assisted birth.    Vacuum assisted birth of liveborn female infant in left occiput anterior position followed by left hand at 1436. Tight nuchal cord reduced on perineum. Fetal body assisted from birth canal. Infant immediately to maternal abdomen. Cord clamped and cut due to decreased tone. Infant given to receiving nurse and neonatologist waiting at warmer, see their note for further details of resuscitation. Cord gases and cord blood collected. APGARs: pending. Weight: 3770 g (8 lb 5 oz).   Pitocin bolus infusing. Spontaneous delivery of intact placenta at 1440. 3B perineal laceration repaired by Dr. Amalia Hailey, see noted. Uterus firm. Lochia small. Counts correct x 2. Vault check completed. QBL: pending.   Initiate routine postpartum care and orders. Mom to postpartum.  Baby to SCN.  Mother of patient present at bedside and overjoyed with the birth of "[name]".    Diona Fanti, CNM [facility], [facility] 09/13/2019, 3:52 PM

## 2018-11-25 ENCOUNTER — Telehealth: Payer: Self-pay | Admitting: Surgical

## 2018-11-25 NOTE — Telephone Encounter (Signed)
LM for patient to call back to schedule an annual exam.

## 2019-02-06 ENCOUNTER — Other Ambulatory Visit (HOSPITAL_COMMUNITY): Payer: Self-pay | Admitting: Family Medicine

## 2019-02-06 LAB — URINALYSIS
Bilirubin, UA: NEGATIVE
Glucose, UA: NEGATIVE
Ketones, UA: NEGATIVE
Leukocytes,UA: NEGATIVE
Nitrite, UA: NEGATIVE
RBC, UA: NEGATIVE
Specific Gravity, UA: 1.03 (ref 1.005–1.030)
Urobilinogen, Ur: 0.2 mg/dL (ref 0.2–1.0)
pH, UA: 6 (ref 5.0–7.5)

## 2019-02-06 LAB — OB RESULTS CONSOLE HGB/HCT, BLOOD: HCT: 42 — AB (ref 29–41)

## 2019-02-06 LAB — OB RESULTS CONSOLE PLATELET COUNT: Platelets: 278

## 2019-02-06 LAB — OB RESULTS CONSOLE ANTIBODY SCREEN: Antibody Screen: NEGATIVE

## 2019-02-06 LAB — OB RESULTS CONSOLE ABO/RH: RH Type: POSITIVE

## 2019-02-06 LAB — HEMOGLOBIN, FINGERSTICK: Hemoglobin: 14.1 g/dL (ref 11.1–15.9)

## 2019-02-06 LAB — OB RESULTS CONSOLE HEPATITIS B SURFACE ANTIGEN: Hepatitis B Surface Ag: NEGATIVE

## 2019-02-06 LAB — OB RESULTS CONSOLE RPR: RPR: NONREACTIVE

## 2019-02-21 ENCOUNTER — Telehealth: Payer: Self-pay | Admitting: *Deleted

## 2019-02-21 NOTE — Telephone Encounter (Signed)
Tried calling pt about screening

## 2019-02-21 NOTE — Telephone Encounter (Signed)
Coronavirus (COVID-19) Are you at risk?  Are you at risk for the Coronavirus (COVID-19)?  To be considered HIGH RISK for Coronavirus (COVID-19), you have to meet the following criteria:  . Traveled to [place], [place], [place], [place] or [place]; or in the [place] to [place], [place], [place], or [place]; and have fever, cough, and shortness of breath within the last 2 weeks of travel OR . Been in close contact with a person diagnosed with COVID-19 within the last 2 weeks and have fever, cough, and shortness of breath . IF YOU DO NOT MEET THESE CRITERIA, YOU ARE CONSIDERED LOW RISK FOR COVID-19.  What to do if you are HIGH RISK for COVID-19?  . If you are having a medical emergency, call 911. . Seek medical care right away. Before you go to a doctor's office, urgent care or emergency department, call ahead and tell them about your recent travel, contact with someone diagnosed with COVID-19, and your symptoms. You should receive instructions from your physician's office regarding next steps of care.  . When you arrive at healthcare provider, tell the healthcare staff immediately you have returned from visiting [place], [place], [place], [place] or [place]; or traveled in the [place] to [place], [place], [place], or [place]; in the last two weeks or you have been in close contact with a person diagnosed with COVID-19 in the last 2 weeks.   . Tell the health care staff about your symptoms: fever, cough and shortness of breath. . After you have been seen by a medical provider, you will be either: o Tested for (COVID-19) and discharged home on quarantine except to seek medical care if symptoms worsen, and asked to  - Stay home and avoid contact with others until you get your results (4-5 days)  - Avoid travel on public transportation if possible (such as bus, train, or airplane) or o Sent to the Emergency Department by EMS for evaluation, COVID-19 testing, and possible  admission depending on your condition and test results.  What to do if you are LOW RISK for COVID-19?  Reduce your risk of any infection by using the same precautions used for avoiding the common cold or flu:  . Wash your hands often with soap and warm water for at least 20 seconds.  If soap and water are not readily available, use an alcohol-based hand sanitizer with at least 60% alcohol.  . If coughing or sneezing, cover your mouth and nose by coughing or sneezing into the elbow areas of your shirt or coat, into a tissue or into your sleeve (not your hands). . Avoid shaking hands with others and consider head nods or verbal greetings only. . Avoid touching your eyes, nose, or mouth with unwashed hands.  . Avoid close contact with people who are sick. . Avoid places or events with large numbers of people in one location, like concerts or sporting events. . Carefully consider travel plans you have or are making. . If you are planning any travel outside or inside the [place], visit the CDC's Travelers' Health webpage for the latest health notices. . If you have some symptoms but not all symptoms, continue to monitor at home and seek medical attention if your symptoms worsen. . If you are having a medical emergency, call 911.   ADDITIONAL HEALTHCARE OPTIONS FOR PATIENTS  [facility] / e-Visit: [email or web address]         [facility]: [phone]  [facility]: [phone]                   [facility]: [phone]   Spoke with pt denies any sx.  Bethani Brugger, CMA 

## 2019-02-24 ENCOUNTER — Ambulatory Visit (INDEPENDENT_AMBULATORY_CARE_PROVIDER_SITE_OTHER): Payer: Medicaid Other | Admitting: Certified Nurse Midwife

## 2019-02-24 ENCOUNTER — Encounter: Payer: Self-pay | Admitting: Certified Nurse Midwife

## 2019-02-24 ENCOUNTER — Other Ambulatory Visit: Payer: Self-pay

## 2019-02-24 VITALS — BP 112/70 | HR 92 | Ht 61.0 in | Wt 132.2 lb

## 2019-02-24 DIAGNOSIS — Z3401 Encounter for supervision of normal first pregnancy, first trimester: Secondary | ICD-10-CM | POA: Diagnosis not present

## 2019-02-24 DIAGNOSIS — Z3402 Encounter for supervision of normal first pregnancy, second trimester: Secondary | ICD-10-CM

## 2019-02-24 DIAGNOSIS — Z3A12 12 weeks gestation of pregnancy: Secondary | ICD-10-CM | POA: Diagnosis not present

## 2019-02-24 LAB — POCT URINALYSIS DIPSTICK OB
Bilirubin, UA: NEGATIVE
Blood, UA: NEGATIVE
Glucose, UA: NEGATIVE
Ketones, UA: NEGATIVE
Leukocytes, UA: NEGATIVE
Nitrite, UA: NEGATIVE
POC,PROTEIN,UA: NEGATIVE
Spec Grav, UA: 1.025 (ref 1.010–1.025)
Urobilinogen, UA: 0.2 E.U./dL
pH, UA: 6 (ref 5.0–8.0)

## 2019-02-24 NOTE — Progress Notes (Signed)
NEW OB HISTORY AND PHYSICAL  SUBJECTIVE:       Selena Rogers is a 23 y.o. G1P0000 female, Patient's last menstrual period was 12/01/2018 (exact date)., Estimated Date of Delivery: 09/07/19, [redacted]w[redacted]d, presents today for establishment of Prenatal Care. She was seen at the health department and states she had pap smear and blood work. She will sign release of medical records today.  She has no unusual complaints    Gynecologic History Patient's last menstrual period was 12/01/2018 (exact date). Normal Contraception: none Last Pap: 02/2019. Results were: unknown   Obstetric History OB History  Gravida Para Term Preterm AB Living  1 0 0 0 0 0  SAB TAB Ectopic Multiple Live Births  0 0 0 0 0    # Outcome Date GA Lbr Len/2nd Weight Sex Delivery Anes PTL Lv  1 Current             Past Medical History:  Diagnosis Date  . Anxiety   . Asthma   . Depression   . Genital herpes   . Seasonal allergies     History reviewed. No pertinent surgical history.  Current Outpatient Medications on File Prior to Visit  Medication Sig Dispense Refill  . albuterol (PROVENTIL) (2.5 MG/3ML) 0.083% nebulizer solution Inhale into the lungs.    Marland Kitchen albuterol (VENTOLIN HFA) 108 (90 Base) MCG/ACT inhaler Inhale into the lungs.    . beclomethasone (QVAR) 40 MCG/ACT inhaler Inhale into the lungs 2 (two) times daily.    . fluticasone (FLONASE) 50 MCG/ACT nasal spray 1 spray by Each Nare route daily.    . montelukast (SINGULAIR) 10 MG tablet Take 10 mg by mouth at bedtime.    . Prenatal Vit-Fe Fumarate-FA (PRENATAL MULTIVITAMIN) TABS tablet Take 1 tablet by mouth daily at 12 noon.     No current facility-administered medications on file prior to visit.     Allergies  Allergen Reactions  . Clindamycin/Lincomycin   . Zithromax [Azithromycin]     Social History   Socioeconomic History  . Marital status: Single    Spouse name: Not on file  . Number of children: Not on file  . Years of education: Not on  file  . Highest education level: Not on file  Occupational History  . Not on file  Social Needs  . Financial resource strain: Not on file  . Food insecurity:    Worry: Not on file    Inability: Not on file  . Transportation needs:    Medical: Not on file    Non-medical: Not on file  Tobacco Use  . Smoking status: Never Smoker  . Smokeless tobacco: Never Used  Substance and Sexual Activity  . Alcohol use: No    Frequency: Never  . Drug use: No  . Sexual activity: Yes    Birth control/protection: None  Lifestyle  . Physical activity:    Days per week: Not on file    Minutes per session: Not on file  . Stress: Not on file  Relationships  . Social connections:    Talks on phone: Not on file    Gets together: Not on file    Attends religious service: Not on file    Active member of club or organization: Not on file    Attends meetings of clubs or organizations: Not on file    Relationship status: Not on file  . Intimate partner violence:    Fear of current or ex partner: Not on file  Emotionally abused: Not on file    Physically abused: Not on file    Forced sexual activity: Not on file  Other Topics Concern  . Not on file  Social History Narrative  . Not on file    Family History  Problem Relation Age of Onset  . Seizures Brother     The following portions of the patient's history were reviewed and updated as appropriate: allergies, current medications, past OB history, past medical history, past surgical history, past family history, past social history, and problem list.    OBJECTIVE: Initial Physical Exam (New OB)  GENERAL APPEARANCE: alert, well appearing, in no apparent distress, oriented to person, place and time HEAD: normocephalic, atraumatic MOUTH: mucous membranes moist, pharynx normal without lesions THYROID: no thyromegaly or masses present BREASTS: no masses noted, no significant tenderness, no palpable axillary nodes, no skin changes LUNGS:  clear to auscultation, no wheezes, rales or rhonchi, symmetric air entry HEART: regular rate and rhythm, no murmurs ABDOMEN: soft, nontender, nondistended, no abnormal masses, no epigastric pain [id number] EXTREMITIES: no redness or tenderness in the calves or thighs SKIN: normal coloration and turgor, no rashes LYMPH NODES: no adenopathy palpable NEUROLOGIC: alert, oriented, normal speech, no focal findings or movement disorder noted  PELVIC EXAM EXTERNAL GENITALIA: normal appearing vulva with no masses, tenderness or lesions VAGINA: no abnormal discharge or lesions CERVIX: no lesions or cervical motion tenderness UTERUS: gravid ADNEXA: no masses palpable and nontender OB EXAM PELVIMETRY: appears adequate RECTUM: exam not indicated  ASSESSMENT: Normal pregnancy  PLAN: New OB counseling: The patient has been given an overview regarding routine prenatal care. Recommendations regarding diet, weight gain, and exercise in pregnancy were given. Prenatal testing, optional genetic testing,carrier screening, and ultrasound use in pregnancy were reviewed. Panorama testing today.  She has history of depression, was on lexapro-stopped. Reviewed medications and depression in pregnancy. Discussed using SSRI if needed. Will continue to monitor pt. Mother to baby fact sheet given on Zoloft. Benefits of Breast Feeding were discussed. The patient is encouraged to consider nursing her baby post partum. Follow up 6 wks for anatomy scan and ROB.   Doreene BurkeAnnie Sitlaly Gudiel, CNM

## 2019-02-24 NOTE — Patient Instructions (Addendum)
Prenatal Care Prenatal care is health care during pregnancy. It helps you and your unborn baby (fetus) stay as healthy as possible. Prenatal care may be provided by a midwife, a family practice health care provider, or a childbirth and pregnancy specialist (obstetrician). How does this affect me? During pregnancy, you will be closely monitored for any new conditions that might develop. To lower your risk of pregnancy complications, you and your health care provider will talk about any underlying conditions you have. How does this affect my baby? Early and consistent prenatal care increases the chance that your baby will be healthy during pregnancy. Prenatal care lowers the risk that your baby will be:  Born early (prematurely).  Smaller than expected at birth (small for gestational age). What can I expect at the first prenatal care visit? Your first prenatal care visit will likely be the longest. You should schedule your first prenatal care visit as soon as you know that you are pregnant. Your first visit is a good time to talk about any questions or concerns you have about pregnancy. At your visit, you and your health care provider will talk about:  Your medical history, including: ? Any past pregnancies. ? Your family's medical history. ? The baby's father's medical history. ? Any long-term (chronic) health conditions you have and how you manage them. ? Any surgeries or procedures you have had. ? Any current over-the-counter or prescription medicines, herbs, or supplements you are taking.  Other factors that could pose a risk to your baby, including:  Your home setting and your stress levels, including: ? Exposure to abuse or violence. ? Household financial strain. ? Mental health conditions you have.  Your daily health habits, including diet and exercise. Your health care provider will also:  Measure your weight, height, and blood pressure.  Do a physical exam, including a pelvic  and breast exam.  Perform blood tests and urine tests to check for: ? Urinary tract infection. ? Sexually transmitted infections (STIs). ? Low iron levels in your blood (anemia). ? Blood type and certain proteins on red blood cells (Rh antibodies). ? Infections and immunity to viruses, such as hepatitis B and rubella. ? HIV (human immunodeficiency virus).  Do an ultrasound to confirm your baby's growth and development and to help predict your estimated due date (EDD). This ultrasound is done with a probe that is inserted into the vagina (transvaginal ultrasound).  Discuss your options for genetic screening.  Give you information about how to keep yourself and your baby healthy, including: ? Nutrition and taking vitamins. ? Physical activity. ? How to manage pregnancy symptoms such as nausea and vomiting (morning sickness). ? Infections and substances that may be harmful to your baby and how to avoid them. ? Food safety. ? Dental care. ? Working. ? Travel. ? Warning signs to watch for and when to call your health care provider. How often will I have prenatal care visits? After your first prenatal care visit, you will have regular visits throughout your pregnancy. The visit schedule is often as follows:  Up to week 28 of pregnancy: once every 4 weeks.  28-36 weeks: once every 2 weeks.  After 36 weeks: every week until delivery. Some women may have visits more or less often depending on any underlying health conditions and the health of the baby. Keep all follow-up and prenatal care visits as told by your health care provider. This is important. What happens during routine prenatal care visits? Your health care provider will:    Measure your weight and blood pressure.  Check for fetal heart sounds.  Measure the height of your uterus in your abdomen (fundal height). This may be measured starting around week 20 of pregnancy.  Check the position of your baby inside your uterus.   Ask questions about your diet, sleeping patterns, and whether you can feel the baby move.  Review warning signs to watch for and signs of labor.  Ask about any pregnancy symptoms you are having and how you are dealing with them. Symptoms may include: ? Headaches. ? Nausea and vomiting. ? Vaginal discharge. ? Swelling. ? Fatigue. ? Constipation. ? Any discomfort, including back or pelvic pain. Make a list of questions to ask your health care provider at your routine visits. What tests might I have during prenatal care visits? You may have blood, urine, and imaging tests throughout your pregnancy, such as:  Urine tests to check for glucose, protein, or signs of infection.  Glucose tests to check for a form of diabetes that can develop during pregnancy (gestational diabetes mellitus). This is usually done around week 24 of pregnancy.  An ultrasound to check your baby's growth and development and to check for birth defects. This is usually done around week 20 of pregnancy.  A test to check for group B strep (GBS) infection. This is usually done around week 36 of pregnancy.  Genetic testing. This may include blood or imaging tests, such as an ultrasound. Some genetic tests are done during the first trimester and some are done during the second trimester. What else can I expect during prenatal care visits? Your health care provider may recommend getting certain vaccines during pregnancy. These may include:  A yearly flu shot (annual influenza vaccine). This is especially important if you will be pregnant during flu season.  Tdap (tetanus, diphtheria, pertussis) vaccine. Getting this vaccine during pregnancy can protect your baby from whooping cough (pertussis) after birth. This vaccine may be recommended between weeks 27 and 36 of pregnancy. Later in your pregnancy, your health care provider may give you information about:  Childbirth and breastfeeding classes.  Choosing a health care  provider for your baby.  Umbilical cord banking.  Breastfeeding.  Birth control after your baby is born.  The hospital labor and delivery unit and how to tour it.  Registering at the hospital before you go into labor. Where to find more information  Office on Women's Health: TravelLesson.cawomenshealth.gov  American Pregnancy Association: americanpregnancy.org  March of Dimes: marchofdimes.org Summary  Prenatal care helps you and your baby stay as healthy as possible during pregnancy.  Your first prenatal care visit will most likely be the longest.  You will have visits and tests throughout your pregnancy to monitor your health and your baby's health.  Bring a list of questions to your visits to ask your health care provider.  Make sure to keep all follow-up and prenatal care visits with your health care provider. This information is not intended to replace advice given to you by your health care provider. Make sure you discuss any questions you have with your health care provider. Document Released: 10/26/2003 Document Revised: 10/22/2017 Document Reviewed: 10/22/2017 Elsevier Interactive Patient Education  2019 [employer]  Eating Plan for Pregnant Women While you are pregnant, your body requires additional nutrition to help support your growing baby. You also have a higher need for some vitamins and minerals, such as folic acid, calcium, iron, and vitamin D. Eating a healthy, well-balanced diet is very important for  your health and your baby's health. Your need for extra calories varies for the three 25-month segments of your pregnancy (trimesters). For most women, it is recommended to consume:  150 extra calories a day during the first trimester.  300 extra calories a day during the second trimester.  300 extra calories a day during the third trimester. What are tips for following this plan?   Do not try to lose weight or go on a diet during pregnancy.  Limit your overall intake  of foods that have "empty calories." These are foods that have little nutritional value, such as sweets, desserts, candies, and sugar-sweetened beverages.  Eat a variety of foods (especially fruits and vegetables) to get a full range of vitamins and minerals.  Take a prenatal vitamin to help meet your additional vitamin and mineral needs during pregnancy, specifically for folic acid, iron, calcium, and vitamin D.  Remember to stay active. Ask your health care provider what types of exercise and activities are safe for you.  Practice good food safety and cleanliness. Wash your hands before you eat and after you prepare raw meat. Wash all fruits and vegetables well before peeling or eating. Taking these actions can help to prevent food-borne illnesses that can be very dangerous to your baby, such as listeriosis. Ask your health care provider for more information about listeriosis. What does 150 extra calories look like? Healthy options that provide 150 extra calories each day could be any of the following:  6-8 oz (170-230 g) of plain low-fat yogurt with  cup of berries.  1 apple with 2 teaspoons (11 g) of peanut butter.  Cut-up vegetables with  cup (60 g) of hummus.  8 oz (230 mL) or 1 cup of low-fat chocolate milk.  1 stick of string cheese with 1 medium orange.  1 peanut butter and jelly sandwich that is made with one slice of whole-wheat bread and 1 tsp (5 g) of peanut butter. For 300 extra calories, you could eat two of those healthy options each day. What is a healthy amount of weight to gain? The right amount of weight gain for you is based on your BMI before you became pregnant. If your BMI:  Was less than 18 (underweight), you should gain 28-40 lb (13-18 kg).  Was 18-24.9 (normal), you should gain 25-35 lb (11-16 kg).  Was 25-29.9 (overweight), you should gain 15-25 lb (7-11 kg).  Was 30 or greater (obese), you should gain 11-20 lb (5-9 kg). What if I am having twins or  multiples? Generally, if you are carrying twins or multiples:  You may need to eat 300-600 extra calories a day.  The recommended range for total weight gain is 25-54 lb (11-25 kg), depending on your BMI before pregnancy.  Talk with your health care provider to find out about nutritional needs, weight gain, and exercise that is right for you. What foods can I eat?  Grains All grains. Choose whole grains, such as whole-wheat bread, oatmeal, or brown rice. Vegetables All vegetables. Eat a variety of colors and types of vegetables. Remember to wash your vegetables well before peeling or eating. Fruits All fruits. Eat a variety of colors and types of fruit. Remember to wash your fruits well before peeling or eating. Meats and other protein foods Lean meats, including chicken, [place], fish, and lean cuts of beef, veal, or pork. If you eat fish or seafood, choose options that are higher in omega-3 fatty acids and lower in mercury, such as salmon,  herring, mussels, trout, sardines, pollock, shrimp, crab, and lobster. Tofu. Tempeh. Beans. Eggs. Peanut butter and other nut butters. Make sure that all meats, poultry, and eggs are cooked to food-safe temperatures or "well-done." Two or more servings of fish are recommended each week in order to get the most benefits from omega-3 fatty acids that are found in seafood. Choose fish that are lower in mercury. You can find more information online:  PumpkinSearch.com.ee Dairy Pasteurized milk and milk alternatives (such as almond milk). Pasteurized yogurt and pasteurized cheese. Cottage cheese. Sour cream. Beverages Water. Juices that contain 100% fruit juice or vegetable juice. Caffeine-free teas and decaffeinated coffee. Drinks that contain caffeine are okay to drink, but it is better to avoid caffeine. Keep your total caffeine intake to less than 200 mg each day (which is 12 oz or 355 mL of coffee, tea, or soda) or the limit as told by your health care provider.  Fats and oils Fats and oils are okay to include in moderation. Sweets and desserts Sweets and desserts are okay to include in moderation. Seasoning and other foods All pasteurized condiments. The items listed above may not be a complete list of recommended foods and beverages. Contact your dietitian for more options. What foods are not recommended? Vegetables Raw (unpasteurized) vegetable juices. Fruits Unpasteurized fruit juices. Meats and other protein foods Lunch meats, bologna, hot dogs, or other deli meats. (If you must eat those meats, reheat them until they are steaming hot.) Refrigerated pat, meat spreads from a meat counter, smoked seafood that is found in the refrigerated section of a store. Raw or undercooked meats, poultry, and eggs. Raw fish, such as sushi or sashimi. Fish that have high mercury content, such as tilefish, shark, swordfish, and king mackerel. To learn more about mercury in fish, talk with your health care provider or look for online resources, such as:  PumpkinSearch.com.ee Dairy Raw (unpasteurized) milk and any foods that have raw milk in them. Soft cheeses, such as feta, queso blanco, queso fresco, Brie, Camembert cheeses, blue-veined cheeses, and Panela cheese (unless it is made with pasteurized milk, which must be stated on the label). Beverages Alcohol. Sugar-sweetened beverages, such as sodas, teas, or energy drinks. Seasoning and other foods Homemade fermented foods and drinks, such as pickles, sauerkraut, or kombucha drinks. (Store-bought pasteurized versions of these are okay.) Salads that are made in a store or deli, such as ham salad, chicken salad, egg salad, tuna salad, and seafood salad. The items listed above may not be a complete list of foods and beverages to avoid. Contact your dietitian for more information. Where to find more information To calculate the number of calories you need based on your height, weight, and activity level, you can use an  online calculator such as:  PackageNews.is To calculate how much weight you should gain during pregnancy, you can use an online pregnancy weight gain calculator such as:  http://jones-berg.com/ Summary  While you are pregnant, your body requires additional nutrition to help support your growing baby.  Eat a variety of foods, especially fruits and vegetables to get a full range of vitamins and minerals.  Practice good food safety and cleanliness. Wash your hands before you eat and after you prepare raw meat. Wash all fruits and vegetables well before peeling or eating. Taking these actions can help to prevent food-borne illnesses, such as listeriosis, that can be very dangerous to your baby.  Do not eat raw meat or fish. Do not eat fish that have high  mercury content, such as tilefish, shark, swordfish, and king mackerel. Do not eat unpasteurized (raw) dairy.  Take a prenatal vitamin to help meet your additional vitamin and mineral needs during pregnancy, specifically for folic acid, iron, calcium, and vitamin D. This information is not intended to replace advice given to you by your health care provider. Make sure you discuss any questions you have with your health care provider. Document Released: 08/07/2014 Document Revised: 07/20/2017 Document Reviewed: 07/20/2017 Elsevier Interactive Patient Education  2019 [employer].

## 2019-02-26 ENCOUNTER — Telehealth: Payer: Self-pay | Admitting: Certified Nurse Midwife

## 2019-02-26 NOTE — Telephone Encounter (Signed)
The patient called and stated that she is experiencing serious symptoms with her depression medication and complete stopped taking the medication. The patient has some concerns she is hopping to discuss with a nurse today. Please advise.

## 2019-03-24 ENCOUNTER — Telehealth: Payer: Self-pay | Admitting: Certified Nurse Midwife

## 2019-03-24 NOTE — Telephone Encounter (Signed)
The patient called and stated that she is experiencing lower pelvic pain and would like to ask a nurse a few questions/discuss her concerns. Please advise.

## 2019-04-10 ENCOUNTER — Encounter: Payer: Medicaid Other | Admitting: Obstetrics and Gynecology

## 2019-04-10 ENCOUNTER — Other Ambulatory Visit: Payer: Medicaid Other

## 2019-04-10 ENCOUNTER — Telehealth: Payer: Self-pay

## 2019-04-10 ENCOUNTER — Other Ambulatory Visit: Payer: Self-pay | Admitting: *Deleted

## 2019-04-10 MED ORDER — VITAFOL GUMMIES 3.33-0.333-34.8 MG PO CHEW: 1.0000 | CHEWABLE_TABLET | Freq: Every day | ORAL | 2 refills | Status: DC

## 2019-04-10 NOTE — Telephone Encounter (Signed)
Would like a gummie PNV sent to pharmacy.  [facility].

## 2019-04-10 NOTE — Telephone Encounter (Signed)
Done-ac 

## 2019-04-16 ENCOUNTER — Other Ambulatory Visit: Payer: Medicaid Other

## 2019-04-16 ENCOUNTER — Encounter: Payer: Medicaid Other | Admitting: Obstetrics and Gynecology

## 2019-04-22 ENCOUNTER — Other Ambulatory Visit: Payer: Medicaid Other

## 2019-04-23 ENCOUNTER — Telehealth: Payer: Self-pay | Admitting: *Deleted

## 2019-04-23 NOTE — Telephone Encounter (Signed)
Coronavirus (COVID-19) Are you at risk?  Are you at risk for the Coronavirus (COVID-19)?  To be considered HIGH RISK for Coronavirus (COVID-19), you have to meet the following criteria:  . Traveled to [place], [place], [place], [place] or [place]; or in the [place] to [place], [place], [place], or [place]; and have fever, cough, and shortness of breath within the last 2 weeks of travel OR . Been in close contact with a person diagnosed with COVID-19 within the last 2 weeks and have fever, cough, and shortness of breath . IF YOU DO NOT MEET THESE CRITERIA, YOU ARE CONSIDERED LOW RISK FOR COVID-19.  What to do if you are HIGH RISK for COVID-19?  . If you are having a medical emergency, call 911. . Seek medical care right away. Before you go to a doctor's office, urgent care or emergency department, call ahead and tell them about your recent travel, contact with someone diagnosed with COVID-19, and your symptoms. You should receive instructions from your physician's office regarding next steps of care.  . When you arrive at healthcare provider, tell the healthcare staff immediately you have returned from visiting [place], [place], [place], [place] or [place]; or traveled in the [place] to [place], [place], [place], or [place]; in the last two weeks or you have been in close contact with a person diagnosed with COVID-19 in the last 2 weeks.   . Tell the health care staff about your symptoms: fever, cough and shortness of breath. . After you have been seen by a medical provider, you will be either: o Tested for (COVID-19) and discharged home on quarantine except to seek medical care if symptoms worsen, and asked to  - Stay home and avoid contact with others until you get your results (4-5 days)  - Avoid travel on public transportation if possible (such as bus, train, or airplane) or o Sent to the Emergency Department by EMS for evaluation, COVID-19 testing, and possible  admission depending on your condition and test results.  What to do if you are LOW RISK for COVID-19?  Reduce your risk of any infection by using the same precautions used for avoiding the common cold or flu:  . Wash your hands often with soap and warm water for at least 20 seconds.  If soap and water are not readily available, use an alcohol-based hand sanitizer with at least 60% alcohol.  . If coughing or sneezing, cover your mouth and nose by coughing or sneezing into the elbow areas of your shirt or coat, into a tissue or into your sleeve (not your hands). . Avoid shaking hands with others and consider head nods or verbal greetings only. . Avoid touching your eyes, nose, or mouth with unwashed hands.  . Avoid close contact with people who are sick. . Avoid places or events with large numbers of people in one location, like concerts or sporting events. . Carefully consider travel plans you have or are making. . If you are planning any travel outside or inside the [place], visit the CDC's Travelers' Health webpage for the latest health notices. . If you have some symptoms but not all symptoms, continue to monitor at home and seek medical attention if your symptoms worsen. . If you are having a medical emergency, call 911.   ADDITIONAL HEALTHCARE OPTIONS FOR PATIENTS  [facility] / e-Visit: [email or web address]         [facility]: [phone]  [facility]: [phone]                   [facility]: [phone]   Spoke with pt denies any sx.  Jovan Schickling, CMA 

## 2019-04-24 ENCOUNTER — Ambulatory Visit (INDEPENDENT_AMBULATORY_CARE_PROVIDER_SITE_OTHER): Payer: Medicaid Other

## 2019-04-24 ENCOUNTER — Other Ambulatory Visit: Payer: Self-pay

## 2019-04-24 ENCOUNTER — Ambulatory Visit (INDEPENDENT_AMBULATORY_CARE_PROVIDER_SITE_OTHER): Payer: Medicaid Other | Admitting: Obstetrics and Gynecology

## 2019-04-24 VITALS — BP 104/71 | HR 91 | Wt 142.6 lb

## 2019-04-24 DIAGNOSIS — Z3402 Encounter for supervision of normal first pregnancy, second trimester: Secondary | ICD-10-CM

## 2019-04-24 DIAGNOSIS — Z3492 Encounter for supervision of normal pregnancy, unspecified, second trimester: Secondary | ICD-10-CM

## 2019-04-24 NOTE — Progress Notes (Signed)
ROB- anatomy scan done today, pt is doing well 

## 2019-04-24 NOTE — Progress Notes (Signed)
ROB with Anatomy scan: Scan below reviewed:    Indications:Anatomy Ultrasound Findings:  Singleton intrauterine pregnancy is visualized with FHR at 152 BPM. Biometrics give an (U/S) Gestational age of [redacted]w[redacted]d and an (U/S) EDD of 09/07/2019; this correlates with the clinically established Estimated Date of Delivery: 09/07/19  Fetal presentation is Cephalic.  EFW: 352 g ( 12 oz ). Placenta: posterior. Grade: 1 AFI: subjectively normal.  Anatomic survey is complete and normal; Gender - female.    Right Ovary is normal in appearance. Left Ovary is normal appearance. Survey of the adnexa demonstrates no adnexal masses. There is no free peritoneal fluid in the cul de sac.  Impression: 1. [redacted]w[redacted]d Viable Singleton Intrauterine pregnancy by U/S. 2. (U/S) EDD is consistent with Clinically established Estimated Date of Delivery: 09/07/19 .   States she is doing well, not working right now. Lives with husband.

## 2019-05-19 ENCOUNTER — Ambulatory Visit (INDEPENDENT_AMBULATORY_CARE_PROVIDER_SITE_OTHER): Payer: Medicaid Other | Admitting: Certified Nurse Midwife

## 2019-05-19 ENCOUNTER — Encounter: Payer: Self-pay | Admitting: Certified Nurse Midwife

## 2019-05-19 ENCOUNTER — Other Ambulatory Visit (HOSPITAL_COMMUNITY)
Admission: RE | Admit: 2019-05-19 | Discharge: 2019-05-19 | Disposition: A | Discharge status: Home / Self Care | Payer: Medicaid Other | Source: Ambulatory Visit | Attending: Certified Nurse Midwife | Admitting: Certified Nurse Midwife

## 2019-05-19 ENCOUNTER — Other Ambulatory Visit: Payer: Self-pay

## 2019-05-19 VITALS — BP 109/64 | HR 86 | Wt 145.3 lb

## 2019-05-19 DIAGNOSIS — O26892 Other specified pregnancy related conditions, second trimester: Secondary | ICD-10-CM

## 2019-05-19 DIAGNOSIS — Z3402 Encounter for supervision of normal first pregnancy, second trimester: Secondary | ICD-10-CM | POA: Diagnosis present

## 2019-05-19 DIAGNOSIS — N898 Other specified noninflammatory disorders of vagina: Secondary | ICD-10-CM | POA: Insufficient documentation

## 2019-05-19 DIAGNOSIS — Z113 Encounter for screening for infections with a predominantly sexual mode of transmission: Secondary | ICD-10-CM

## 2019-05-19 DIAGNOSIS — Z130 Encounter for screening for diseases of the blood and blood-forming organs and certain disorders involving the immune mechanism: Secondary | ICD-10-CM

## 2019-05-19 DIAGNOSIS — Z3A24 24 weeks gestation of pregnancy: Secondary | ICD-10-CM

## 2019-05-19 DIAGNOSIS — Z131 Encounter for screening for diabetes mellitus: Secondary | ICD-10-CM

## 2019-05-19 DIAGNOSIS — Z3403 Encounter for supervision of normal first pregnancy, third trimester: Secondary | ICD-10-CM

## 2019-05-19 LAB — POCT URINALYSIS DIPSTICK OB
Bilirubin, UA: NEGATIVE
Blood, UA: NEGATIVE
Glucose, UA: NEGATIVE
Ketones, UA: NEGATIVE
Leukocytes, UA: NEGATIVE
Nitrite, UA: NEGATIVE
POC,PROTEIN,UA: NEGATIVE
Spec Grav, UA: 1.01 (ref 1.010–1.025)
Urobilinogen, UA: 0.2 E.U./dL
pH, UA: 8 (ref 5.0–8.0)

## 2019-05-19 NOTE — Progress Notes (Signed)
Subjective:   Selena Rogers is a 23 y.o. G1P0000 [redacted]w[redacted]d being seen today for her obstetrical visit.  Patient reports leakage of thick, yellow vaginal discharge on Saturday followed by clear vaginal discharge two (2) hours later.  Contacted nursing line and was advised to follow up this morning. Has not noticed leakage of discharge since Saturday.   Denies contractions, vaginal bleeding or leaking of fluid.  Reports good fetal movement.  Denies difficulty breathing or respiratory distress, chest pain, vaginal bleeding, dysuria, and leg pain or swelling.   The following portions of the patient's history were reviewed and updated as appropriate: allergies, current medications, past family history, past medical history, past social history, past surgical history and problem list.   Objective:   BP 109/64   Pulse 86   Wt 145 lb 4.8 oz (65.9 kg)   LMP 12/01/2018 (Exact Date)   BMI 27.45 kg/m   FHT: Fetal Heart Rate (bpm): 159  Uterine Size: Fundal Height: 24 cm  Fetal Movement: Movement: Present   Abdomen:  soft, gravid, appropriate for gestational age,non-tender  Vaginal:  Discharge, white   Cervix: Closed,Long on visualization   Results for orders placed or performed in visit on 05/19/19 (from the past 24 hour(s))  POC Urinalysis Dipstick OB     Status: None   Collection Time: 05/19/19  2:35 PM  Result Value Ref Range   Color, UA yellow    Clarity, UA clear    Glucose, UA Negative Negative   Bilirubin, UA neg    Ketones, UA neg    Spec Grav, UA 1.010 1.010 - 1.025   Blood, UA neg    pH, UA 8.0 5.0 - 8.0   POC,PROTEIN,UA Negative Negative, Trace, Small (1+), Moderate (2+), Large (3+), 4+   Urobilinogen, UA 0.2 0.2 or 1.0 E.U./dL   Nitrite, UA neg    Leukocytes, UA Negative Negative   Appearance yellow    Odor      Assessment:   Pregnancy:  G1P0000 at [redacted]w[redacted]d  1. Encounter for supervision of normal first pregnancy in second trimester  - POC Urinalysis Dipstick  OB  2. Vaginal discharge during pregnancy in second trimester   Plan:   Vaginal swab collected, see orders. Will contact patient with results.   Preterm labor symptoms: vaginal bleeding, contractions and leaking of fluid reviewed in detail.  Fetal movement precautions reviewed.  Follow up in 4 weeks for 28 week labs and ROB or sooner if needed.   Diona Fanti, CNM [facility], [facility] 05/19/19 3:28 PM

## 2019-05-19 NOTE — Progress Notes (Signed)
OB-Pt present due to noticing on Saturday thick yellow discharge on her leg. Pt stated that she called the nurse hotline thinking that she was leaking fluids. Pt stated that she noticed it again at another bathroom visit and the discharge was clear. Pt stated that she has no noticed it since. Pt denies any pain, bleeding nor cramping.

## 2019-05-19 NOTE — Patient Instructions (Signed)
WHAT OB PATIENTS CAN EXPECT   Confirmation of pregnancy and ultrasound ordered if medically indicated-[redacted] weeks gestation  New OB (NOB) intake with nurse and New OB (NOB) labs- [redacted] weeks gestation  New OB (NOB) physical examination with provider- 11/[redacted] weeks gestation  Flu vaccine-[redacted] weeks gestation  Anatomy scan-[redacted] weeks gestation  Glucose tolerance test, blood work to test for anemia, T-dap vaccine-[redacted] weeks gestation  Vaginal swabs/cultures-STD/Group B strep-[redacted] weeks gestation  Appointments every 4 weeks until 28 weeks  Every 2 weeks from 28 weeks until 36 weeks  Weekly visits from 36 weeks until delivery  Eating Plan for Pregnant Women While you are pregnant, your body requires additional nutrition to help support your growing baby. You also have a higher need for some vitamins and minerals, such as folic acid, calcium, iron, and vitamin D. Eating a healthy, well-balanced diet is very important for your health and your baby's health. Your need for extra calories varies for the three 22-monthsegments of your pregnancy (trimesters). For most women, it is recommended to consume:  150 extra calories a day during the first trimester.  300 extra calories a day during the second trimester.  300 extra calories a day during the third trimester. What are tips for following this plan?   Do not try to lose weight or go on a diet during pregnancy.  Limit your overall intake of foods that have "empty calories." These are foods that have little nutritional value, such as sweets, desserts, candies, and sugar-sweetened beverages.  Eat a variety of foods (especially fruits and vegetables) to get a full range of vitamins and minerals.  Take a prenatal vitamin to help meet your additional vitamin and mineral needs during pregnancy, specifically for folic acid, iron, calcium, and vitamin D.  Remember to stay active. Ask your health care provider what types of exercise and activities are safe for  you.  Practice good food safety and cleanliness. Wash your hands before you eat and after you prepare raw meat. Wash all fruits and vegetables well before peeling or eating. Taking these actions can help to prevent food-borne illnesses that can be very dangerous to your baby, such as listeriosis. Ask your health care provider for more information about listeriosis. What does 150 extra calories look like? Healthy options that provide 150 extra calories each day could be any of the following:  6-8 oz (170-230 g) of plain low-fat yogurt with  cup of berries.  1 apple with 2 teaspoons (11 g) of peanut butter.  Cut-up vegetables with  cup (60 g) of hummus.  8 oz (230 mL) or 1 cup of low-fat chocolate milk.  1 stick of string cheese with 1 medium orange.  1 peanut butter and jelly sandwich that is made with one slice of whole-wheat bread and 1 tsp (5 g) of peanut butter. For 300 extra calories, you could eat two of those healthy options each day. What is a healthy amount of weight to gain? The right amount of weight gain for you is based on your BMI before you became pregnant. If your BMI:  Was less than 18 (underweight), you should gain 28-40 lb (13-18 kg).  Was 18-24.9 (normal), you should gain 25-35 lb (11-16 kg).  Was 25-29.9 (overweight), you should gain 15-25 lb (7-11 kg).  Was 30 or greater (obese), you should gain 11-20 lb (5-9 kg). What if I am having twins or multiples? Generally, if you are carrying twins or multiples:  You may need to eat 300-600  extra calories a day.  The recommended range for total weight gain is 25-54 lb (11-25 kg), depending on your BMI before pregnancy.  Talk with your health care provider to find out about nutritional needs, weight gain, and exercise that is right for you. What foods can I eat?  Grains All grains. Choose whole grains, such as whole-wheat bread, oatmeal, or Span rice. Vegetables All vegetables. Eat a variety of colors and types  of vegetables. Remember to wash your vegetables well before peeling or eating. Fruits All fruits. Eat a variety of colors and types of fruit. Remember to wash your fruits well before peeling or eating. Meats and other protein foods Lean meats, including chicken, [place], fish, and lean cuts of beef, veal, or pork. If you eat fish or seafood, choose options that are higher in omega-3 fatty acids and lower in mercury, such as salmon, herring, mussels, trout, sardines, pollock, shrimp, crab, and lobster. Tofu. Tempeh. Beans. Eggs. Peanut butter and other nut butters. Make sure that all meats, poultry, and eggs are cooked to food-safe temperatures or "well-done." Two or more servings of fish are recommended each week in order to get the most benefits from omega-3 fatty acids that are found in seafood. Choose fish that are lower in mercury. You can find more information online:  [employer]amGaming.ch Dairy Pasteurized milk and milk alternatives (such as almond milk). Pasteurized yogurt and pasteurized cheese. Cottage cheese. Sour cream. Beverages Water. Juices that contain 100% fruit juice or vegetable juice. Caffeine-free teas and decaffeinated coffee. Drinks that contain caffeine are okay to drink, but it is better to avoid caffeine. Keep your total caffeine intake to less than 200 mg each day (which is 12 oz or 355 mL of coffee, tea, or soda) or the limit as told by your health care provider. Fats and oils Fats and oils are okay to include in moderation. Sweets and desserts Sweets and desserts are okay to include in moderation. Seasoning and other foods All pasteurized condiments. The items listed above may not be a complete list of recommended foods and beverages. Contact your dietitian for more options. The items listed above may not be a complete list of foods and beverages [you/your child] can eat. Contact a dietitian for more information. What foods are not recommended? Vegetables Raw (unpasteurized)  vegetable juices. Fruits Unpasteurized fruit juices. Meats and other protein foods Lunch meats, bologna, hot dogs, or other deli meats. (If you must eat those meats, reheat them until they are steaming hot.) Refrigerated pat, meat spreads from a meat counter, smoked seafood that is found in the refrigerated section of a store. Raw or undercooked meats, poultry, and eggs. Raw fish, such as sushi or sashimi. Fish that have high mercury content, such as tilefish, shark, swordfish, and king mackerel. To learn more about mercury in fish, talk with your health care provider or look for online resources, such as:  [employer]aming.ch Dairy Raw (unpasteurized) milk and any foods that have raw milk in them. Soft cheeses, such as feta, queso blanco, queso fresco, Brie, Camembert cheeses, blue-veined cheeses, and Panela cheese (unless it is made with pasteurized milk, which must be stated on the label). Beverages Alcohol. Sugar-sweetened beverages, such as sodas, teas, or energy drinks. Seasoning and other foods Homemade fermented foods and drinks, such as pickles, sauerkraut, or kombucha drinks. (Store-bought pasteurized versions of these are okay.) Salads that are made in a store or deli, such as ham salad, chicken salad, egg salad, tuna salad, and seafood salad. The items  listed above may not be a complete list of foods and beverages to avoid. Contact your dietitian for more information. The items listed above may not be a complete list of foods and beverages [you/your child] should avoid. Contact a dietitian for more information. Where to find more information To calculate the number of calories you need based on your height, weight, and activity level, you can use an online calculator such as:  PackageNews.iswww.choosemyplate.gov/MyPlatePlan To calculate how much weight you should gain during pregnancy, you can use an online pregnancy weight gain calculator such as:   http://jones-berg.com/www.choosemyplate.gov/pregnancy-weight-gain-calculator Summary  While you are pregnant, your body requires additional nutrition to help support your growing baby.  Eat a variety of foods, especially fruits and vegetables to get a full range of vitamins and minerals.  Practice good food safety and cleanliness. Wash your hands before you eat and after you prepare raw meat. Wash all fruits and vegetables well before peeling or eating. Taking these actions can help to prevent food-borne illnesses, such as listeriosis, that can be very dangerous to your baby.  Do not eat raw meat or fish. Do not eat fish that have high mercury content, such as tilefish, shark, swordfish, and king mackerel. Do not eat unpasteurized (raw) dairy.  Take a prenatal vitamin to help meet your additional vitamin and mineral needs during pregnancy, specifically for folic acid, iron, calcium, and vitamin D. This information is not intended to replace advice given to you by your health care provider. Make sure you discuss any questions you have with your health care provider. Document Released: 08/07/2014 Document Revised: 02/13/2019 Document Reviewed: 07/20/2017 Elsevier Patient Education  2020 [employer] Third Trimester of Pregnancy  The third trimester is from week 28 through week 40 (months 7 through 9). This trimester is when your unborn baby (fetus) is growing very fast. At the end of the ninth month, the unborn baby is about 20 inches in length. It weighs about 6-10 pounds. Follow these instructions at home: Medicines  Take over-the-counter and prescription medicines only as told by your doctor. Some medicines are safe and some medicines are not safe during pregnancy.  Take a prenatal vitamin that contains at least 600 micrograms (mcg) of folic acid.  If you have trouble pooping (constipation), take medicine that will make your stool soft (stool softener) if your doctor approves. Eating and drinking   Eat  regular, healthy meals.  Avoid raw meat and uncooked cheese.  If you get low calcium from the food you eat, talk to your doctor about taking a daily calcium supplement.  Eat four or five small meals rather than three large meals a day.  Avoid foods that are high in fat and sugars, such as fried and sweet foods.  To prevent constipation: ? Eat foods that are high in fiber, like fresh fruits and vegetables, whole grains, and beans. ? Drink enough fluids to keep your pee (urine) clear or pale yellow. Activity  Exercise only as told by your doctor. Stop exercising if you start to have cramps.  Avoid heavy lifting, wear low heels, and sit up straight.  Do not exercise if it is too hot, too humid, or if you are in a place of great height (high altitude).  You may continue to have sex unless your doctor tells you not to. Relieving pain and discomfort  Wear a good support bra if your breasts are tender.  Take frequent breaks and rest with your legs raised if you have leg cramps  or low back pain.  Take warm water baths (sitz baths) to soothe pain or discomfort caused by hemorrhoids. Use hemorrhoid cream if your doctor approves.  If you develop puffy, bulging veins (varicose veins) in your legs: ? Wear support hose or compression stockings as told by your doctor. ? Raise (elevate) your feet for 15 minutes, 3-4 times a day. ? Limit salt in your food. Safety  Wear your seat belt when driving.  Make a list of emergency phone numbers, including numbers for family, friends, the hospital, and police and fire departments. Preparing for your baby's arrival To prepare for the arrival of your baby:  Take prenatal classes.  Practice driving to the hospital.  Visit the hospital and tour the maternity area.  Talk to your work about taking leave once the baby comes.  Pack your hospital bag.  Prepare the baby's room.  Go to your doctor visits.  Buy a rear-facing car seat. Learn how to  install it in your car. General instructions  Do not use hot tubs, steam rooms, or saunas.  Do not use any products that contain nicotine or tobacco, such as cigarettes and e-cigarettes. If you need help quitting, ask your doctor.  Do not drink alcohol.  Do not douche or use tampons or scented sanitary pads.  Do not cross your legs for long periods of time.  Do not travel for long distances unless you must. Only do so if your doctor says it is okay.  Visit your dentist if you have not gone during your pregnancy. Use a soft toothbrush to brush your teeth. Be gentle when you floss.  Avoid cat litter boxes and soil used by cats. These carry germs that can cause birth defects in the baby and can cause a loss of your baby (miscarriage) or stillbirth.  Keep all your prenatal visits as told by your doctor. This is important. Contact a doctor if:  You are not sure if you are in labor or if your water has broken.  You are dizzy.  You have mild cramps or pressure in your lower belly.  You have a nagging pain in your belly area.  You continue to feel sick to your stomach, you throw up, or you have watery poop.  You have bad smelling fluid coming from your vagina.  You have pain when you pee. Get help right away if:  You have a fever.  You are leaking fluid from your vagina.  You are spotting or bleeding from your vagina.  You have severe belly cramps or pain.  You lose or gain weight quickly.  You have trouble catching your breath and have chest pain.  You notice sudden or extreme puffiness (swelling) of your face, hands, ankles, feet, or legs.  You have not felt the baby move in over an hour.  You have severe headaches that do not go away with medicine.  You have trouble seeing.  You are leaking, or you are having a gush of fluid, from your vagina before you are 37 weeks.  You have regular belly spasms (contractions) before you are 37 weeks. Summary  The third  trimester is from week 28 through week 40 (months 7 through 9). This time is when your unborn baby is growing very fast.  Follow your doctor's advice about medicine, food, and activity.  Get ready for the arrival of your baby by taking prenatal classes, getting all the baby items ready, preparing the baby's room, and visiting your doctor to  be checked.  Get help right away if you are bleeding from your vagina, or you have chest pain and trouble catching your breath, or if you have not felt your baby move in over an hour. This information is not intended to replace advice given to you by your health care provider. Make sure you discuss any questions you have with your health care provider. Document Released: 01/17/2010 Document Revised: 02/13/2019 Document Reviewed: 11/28/2016 Elsevier Patient Education  2020 [employer]

## 2019-05-21 LAB — CERVICOVAGINAL ANCILLARY ONLY
Bacterial vaginitis: NEGATIVE
Candida vaginitis: NEGATIVE
Chlamydia: NEGATIVE
Neisseria Gonorrhea: NEGATIVE
Trichomonas: NEGATIVE

## 2019-05-22 ENCOUNTER — Encounter: Payer: Self-pay | Admitting: Certified Nurse Midwife

## 2019-05-22 ENCOUNTER — Encounter: Payer: Medicaid Other | Admitting: Certified Nurse Midwife

## 2019-05-22 DIAGNOSIS — Z8659 Personal history of other mental and behavioral disorders: Secondary | ICD-10-CM | POA: Insufficient documentation

## 2019-05-22 DIAGNOSIS — A6000 Herpesviral infection of urogenital system, unspecified: Secondary | ICD-10-CM | POA: Insufficient documentation

## 2019-05-22 DIAGNOSIS — J45909 Unspecified asthma, uncomplicated: Secondary | ICD-10-CM | POA: Insufficient documentation

## 2019-06-01 ENCOUNTER — Encounter: Payer: Self-pay | Admitting: Emergency Medicine

## 2019-06-01 ENCOUNTER — Emergency Department: Payer: Medicaid Other

## 2019-06-01 ENCOUNTER — Other Ambulatory Visit: Payer: Self-pay

## 2019-06-01 ENCOUNTER — Emergency Department
Admission: EM | Admit: 2019-06-01 | Discharge: 2019-06-01 | Disposition: A | Discharge status: Home / Self Care | Payer: Medicaid Other | Attending: Emergency Medicine | Admitting: Emergency Medicine

## 2019-06-01 DIAGNOSIS — R0602 Shortness of breath: Secondary | ICD-10-CM | POA: Insufficient documentation

## 2019-06-01 DIAGNOSIS — J45909 Unspecified asthma, uncomplicated: Secondary | ICD-10-CM | POA: Insufficient documentation

## 2019-06-01 DIAGNOSIS — O99512 Diseases of the respiratory system complicating pregnancy, second trimester: Secondary | ICD-10-CM | POA: Insufficient documentation

## 2019-06-01 DIAGNOSIS — O9989 Other specified diseases and conditions complicating pregnancy, childbirth and the puerperium: Secondary | ICD-10-CM | POA: Diagnosis not present

## 2019-06-01 DIAGNOSIS — R079 Chest pain, unspecified: Secondary | ICD-10-CM | POA: Insufficient documentation

## 2019-06-01 DIAGNOSIS — Z3A26 26 weeks gestation of pregnancy: Secondary | ICD-10-CM | POA: Diagnosis not present

## 2019-06-01 LAB — BASIC METABOLIC PANEL
Anion gap: 7 (ref 5–15)
BUN: 7 mg/dL (ref 6–20)
CO2: 21 mmol/L — ABNORMAL LOW (ref 22–32)
Calcium: 8.7 mg/dL — ABNORMAL LOW (ref 8.9–10.3)
Chloride: 109 mmol/L (ref 98–111)
Creatinine, Ser: 0.46 mg/dL (ref 0.44–1.00)
GFR calc Af Amer: >60 mL/min (ref >60)
GFR calc non Af Amer: >60 mL/min (ref >60)
Glucose, Bld: 112 mg/dL — ABNORMAL HIGH (ref 70–99)
Potassium: 3.5 mmol/L (ref 3.5–5.1)
Sodium: 137 mmol/L (ref 135–145)

## 2019-06-01 LAB — CBC
HCT: 36.5 % (ref 36.0–46.0)
Hemoglobin: 11.9 g/dL — ABNORMAL LOW (ref 12.0–15.0)
MCH: 29.2 pg (ref 26.0–34.0)
MCHC: 32.6 g/dL (ref 30.0–36.0)
MCV: 89.7 fL (ref 80.0–100.0)
Platelets: 228 10*3/uL (ref 150–400)
RBC: 4.07 MIL/uL (ref 3.87–5.11)
RDW: 13.7 % (ref 11.5–15.5)
WBC: 11.8 10*3/uL — ABNORMAL HIGH (ref 4.0–10.5)
nRBC: 0 % (ref 0.0–0.2)

## 2019-06-01 LAB — TROPONIN I (HIGH SENSITIVITY): Troponin I (High Sensitivity): 3 ng/L (ref <18)

## 2019-06-01 MED ORDER — SODIUM CHLORIDE 0.9% FLUSH: 3.0000 mL | Freq: Once | INTRAVENOUS | Status: DC

## 2019-06-01 NOTE — ED Provider Notes (Signed)
[facility] Emergency Department Provider Note       Time seen: ----------------------------------------- 6:19 PM on 06/01/2019 -----------------------------------------   I have reviewed the triage vital signs and the nursing notes.  HISTORY   Chief Complaint Chest Pain    HPI Selena Rogers is a 23 y.o. female with a history of anxiety, asthma, depression who presents to the ED for chest tightness that started this morning.  She was not sure if she was having an asthma attack so she used her inhaler.  She was complaining of some shortness of breath on arrival here.  States pain started this morning, is currently [redacted] weeks pregnant.  Has had normal fetal movement.  Past Medical History:  Diagnosis Date  . Anxiety   . Asthma   . Depression   . Genital herpes   . Seasonal allergies     Patient Active Problem List   Diagnosis Date Noted  . Genital herpes 05/22/2019  . Asthma 05/22/2019  . History of depression 05/22/2019    History reviewed. No pertinent surgical history.  Allergies Clindamycin/lincomycin and Zithromax [azithromycin]  Social History Social History   Tobacco Use  . Smoking status: Never Smoker  . Smokeless tobacco: Never Used  Substance Use Topics  . Alcohol use: No    Frequency: Never  . Drug use: No   Review of Systems Constitutional: Negative for fever. Cardiovascular: Positive for chest tightness Respiratory: Positive for shortness of breath Gastrointestinal: Negative for abdominal pain, vomiting and diarrhea. Musculoskeletal: Negative for back pain. Skin: Negative for rash. Neurological: Negative for headaches, focal weakness or numbness.  All systems negative/normal/unremarkable except as stated in the HPI  ____________________________________________   PHYSICAL EXAM:  VITAL SIGNS: ED Triage Vitals [06/01/19 1708]  Enc Vitals Group     BP 114/70     Pulse Rate (!) 106     Resp 20     Temp 98.9 F  (37.2 C)     Temp Source Oral     SpO2 98 %     Weight 145 lb 4.8 oz (65.9 kg)     Height 5\' 1"  (1.549 m)     Head Circumference      Peak Flow      Pain Score 6     Pain Loc      Pain Edu?      Excl. in [place]?     Constitutional: Alert and oriented. Well appearing and in no distress. Eyes: Conjunctivae are normal. Normal extraocular movements. ENT      Head: Normocephalic and atraumatic.      Nose: No congestion/rhinnorhea.      Mouth/Throat: Mucous membranes are moist.      Neck: No stridor. Cardiovascular: Normal rate, regular rhythm. No murmurs, rubs, or gallops. Respiratory: Normal respiratory effort without tachypnea nor retractions. Breath sounds are clear and equal bilaterally. No wheezes/rales/rhonchi. Gastrointestinal: Soft and nontender. Normal bowel sounds Musculoskeletal: Nontender with normal range of motion in extremities. No lower extremity tenderness nor edema. Neurologic:  Normal speech and language. No gross focal neurologic deficits are appreciated.  Skin:  Skin is warm, dry and intact. No rash noted. Psychiatric: Mood and affect are normal. Speech and behavior are normal.  ____________________________________________  EKG: Interpreted by me.  Sinus tachycardia with rate of 102 bpm, normal PR interval, normal QRS, normal QT  ____________________________________________  ED COURSE:  As part of my medical decision making, I reviewed the following data within the [place] History obtained  from family if available, nursing notes, old chart and ekg, as well as notes from prior ED visits. Patient presented for chest tightness, we will assess with labs and imaging as indicated at this time.   Procedures  Selena Rogers was evaluated in Emergency Department on 06/01/2019 for the symptoms described in the history of present illness. She was evaluated in the context of the global COVID-19 pandemic, which necessitated consideration that the patient  might be at risk for infection with the SARS-CoV-2 virus that causes COVID-19. Institutional protocols and algorithms that pertain to the evaluation of patients at risk for COVID-19 are in a state of rapid change based on information released by regulatory bodies including the CDC and federal and state organizations. These policies and algorithms were followed during the patient's care in the ED.  ____________________________________________   LABS (pertinent positives/negatives)  Labs Reviewed  BASIC METABOLIC PANEL - Abnormal; Notable for the following components:      Result Value   CO2 21 (*)    Glucose, Bld 112 (*)    Calcium 8.7 (*)    All other components within normal limits  CBC - Abnormal; Notable for the following components:   WBC 11.8 (*)    Hemoglobin 11.9 (*)    All other components within normal limits  POC URINE PREG, ED  TROPONIN I (HIGH SENSITIVITY)  TROPONIN I (HIGH SENSITIVITY)    RADIOLOGY Images were viewed by me  Chest x-ray is unremarkable  ____________________________________________   DIFFERENTIAL DIAGNOSIS   Musculoskeletal pain, GERD, anxiety, PE unlikely  FINAL ASSESSMENT AND PLAN  Chest pain   Plan: The patient had presented for nonspecific chest pain. Patient's labs were reassuring. Patient's imaging did not reveal any acute process.  She is cleared for outpatient follow-up.   Ulice DashJohnathan E Williams, MD    Note: This note was generated in part or whole with voice recognition software. Voice recognition is usually quite accurate but there are transcription errors that can and very often do occur. I apologize for any typographical errors that were not detected and corrected.     Emily FilbertWilliams, Jonathan E, MD 06/01/19 [phone]

## 2019-06-01 NOTE — ED Notes (Signed)
Pt reports that her chest pain started this AM and was tight. She used her rescue inhaler and it began to feel a little better. Pt says it has gotten better since she's been here. Pt reports that now her pain is a 2/10.

## 2019-06-01 NOTE — ED Triage Notes (Signed)
Pt presents to ED via POV, states is [redacted]weeks pregnant, woke up this AM with c/o chest tightness that started this morning. Pt also c/o SOB at this time. Pt states pain started this morning.

## 2019-06-02 ENCOUNTER — Other Ambulatory Visit: Payer: Self-pay

## 2019-06-02 ENCOUNTER — Telehealth: Payer: Self-pay | Admitting: Certified Nurse Midwife

## 2019-06-02 ENCOUNTER — Other Ambulatory Visit: Payer: Self-pay | Admitting: Certified Nurse Midwife

## 2019-06-02 DIAGNOSIS — J45909 Unspecified asthma, uncomplicated: Secondary | ICD-10-CM

## 2019-06-02 MED ORDER — BECLOMETHASONE DIPROPIONATE 40 MCG/ACT IN AERS: 2.0000 | INHALATION_SPRAY | Freq: Two times a day (BID) | RESPIRATORY_TRACT | 0 refills | Status: DC

## 2019-06-02 MED ORDER — ALBUTEROL SULFATE (2.5 MG/3ML) 0.083% IN NEBU: 2.5000 mg | INHALATION_SOLUTION | Freq: Four times a day (QID) | RESPIRATORY_TRACT | 0 refills | Status: DC | PRN

## 2019-06-02 NOTE — Telephone Encounter (Signed)
Patient returned call.  Aware that pulmonologist referral will be placed. Got clarification that she needs Albuterol inhaler and not nebulizer solution.  Patient aware prescription has been sent and verbalized understanding.

## 2019-06-02 NOTE — Telephone Encounter (Signed)
Who was the original prescriber of her medications?! Has she seen a pumonloogist since becoming pregnant?

## 2019-06-02 NOTE — Telephone Encounter (Signed)
LMTRC

## 2019-06-02 NOTE — Telephone Encounter (Signed)
The patient called and stated that she needs two prescription refills on albuterol (PROVENTIL) (2.5 MG/3ML) 0.083% nebulizer solution [250] and beclomethasone (QVAR) 40 MCG/ACT inhaler [28970]. The pt did not disclose any other information. Please advise.

## 2019-06-16 ENCOUNTER — Other Ambulatory Visit: Payer: Medicaid Other

## 2019-06-16 ENCOUNTER — Other Ambulatory Visit: Payer: Self-pay

## 2019-06-16 ENCOUNTER — Encounter: Payer: Self-pay | Admitting: Certified Nurse Midwife

## 2019-06-16 ENCOUNTER — Ambulatory Visit (INDEPENDENT_AMBULATORY_CARE_PROVIDER_SITE_OTHER): Payer: Medicaid Other | Admitting: Certified Nurse Midwife

## 2019-06-16 DIAGNOSIS — Z131 Encounter for screening for diabetes mellitus: Secondary | ICD-10-CM

## 2019-06-16 DIAGNOSIS — Z3402 Encounter for supervision of normal first pregnancy, second trimester: Secondary | ICD-10-CM | POA: Diagnosis not present

## 2019-06-16 DIAGNOSIS — Z113 Encounter for screening for infections with a predominantly sexual mode of transmission: Secondary | ICD-10-CM

## 2019-06-16 DIAGNOSIS — Z3403 Encounter for supervision of normal first pregnancy, third trimester: Secondary | ICD-10-CM

## 2019-06-16 DIAGNOSIS — Z130 Encounter for screening for diseases of the blood and blood-forming organs and certain disorders involving the immune mechanism: Secondary | ICD-10-CM

## 2019-06-16 LAB — POCT URINALYSIS DIPSTICK OB
Bilirubin, UA: NEGATIVE
Blood, UA: NEGATIVE
Glucose, UA: NEGATIVE
Ketones, UA: NEGATIVE
Leukocytes, UA: NEGATIVE
Nitrite, UA: NEGATIVE
POC,PROTEIN,UA: NEGATIVE
Spec Grav, UA: 1.01 (ref 1.010–1.025)
Urobilinogen, UA: 0.2 E.U./dL
pH, UA: 5 (ref 5.0–8.0)

## 2019-06-16 MED ORDER — TETANUS-DIPHTH-ACELL PERTUSSIS 5-2.5-18.5 LF-MCG/0.5 IM SUSP
0.5000 mL | Freq: Once | INTRAMUSCULAR | Status: AC
  Administered 2019-06-16: 0.5 mL via INTRAMUSCULAR

## 2019-06-16 NOTE — Patient Instructions (Signed)
Glucose Tolerance Test During Pregnancy Why am I having this test? The glucose tolerance test (GTT) is done to check how your body processes sugar (glucose). This is one of several tests used to diagnose diabetes that develops during pregnancy (gestational diabetes mellitus). Gestational diabetes is a temporary form of diabetes that some women develop during pregnancy. It usually occurs during the second trimester of pregnancy and goes away after delivery. Testing (screening) for gestational diabetes usually occurs between 24 and 28 weeks of pregnancy. You may have the GTT test after having a 1-hour glucose screening test if the results from that test indicate that you may have gestational diabetes. You may also have this test if:  You have a history of gestational diabetes.  You have a history of giving birth to very large babies or have experienced repeated fetal loss (stillbirth).  You have signs and symptoms of diabetes, such as: ? Changes in your vision. ? Tingling or numbness in your hands or feet. ? Changes in hunger, thirst, and urination that are not otherwise explained by your pregnancy. What is being tested? This test measures the amount of glucose in your blood at different times during a period of 3 hours. This indicates how well your body is able to process glucose. What kind of sample is taken?  Blood samples are required for this test. They are usually collected by inserting a needle into a blood vessel. How do I prepare for this test?  For 3 days before your test, eat normally. Have plenty of carbohydrate-rich foods.  Follow instructions from your health care provider about: ? Eating or drinking restrictions on the day of the test. You may be asked to not eat or drink anything other than water (fast) starting 8-10 hours before the test. ? Changing or stopping your regular medicines. Some medicines may interfere with this test. Tell a health care provider about:  All  medicines you are taking, including vitamins, herbs, eye drops, creams, and over-the-counter medicines.  Any blood disorders you have.  Any surgeries you have had.  Any medical conditions you have. What happens during the test? First, your blood glucose will be measured. This is referred to as your fasting blood glucose, since you fasted before the test. Then, you will drink a glucose solution that contains a certain amount of glucose. Your blood glucose will be measured again 1, 2, and 3 hours after drinking the solution. This test takes about 3 hours to complete. You will need to stay at the testing location during this time. During the testing period:  Do not eat or drink anything other than the glucose solution.  Do not exercise.  Do not use any products that contain nicotine or tobacco, such as cigarettes and e-cigarettes. If you need help stopping, ask your health care provider. The testing procedure may vary among health care providers and hospitals. How are the results reported? Your results will be reported as milligrams of glucose per deciliter of blood (mg/dL) or millimoles per liter (mmol/L). Your health care provider will compare your results to normal ranges that were established after testing a large group of people (reference ranges). Reference ranges may vary among labs and hospitals. For this test, common reference ranges are:  Fasting: less than 95-105 mg/dL (5.3-5.8 mmol/L).  1 hour after drinking glucose: less than 180-190 mg/dL (10.0-10.5 mmol/L).  2 hours after drinking glucose: less than 155-165 mg/dL (8.6-9.2 mmol/L).  3 hours after drinking glucose: 140-145 mg/dL (7.8-8.1 mmol/L). What do the   results mean? Results within reference ranges are considered normal, meaning that your glucose levels are well-controlled. If two or more of your blood glucose levels are high, you may be diagnosed with gestational diabetes. If only one level is high, your health care  provider may suggest repeat testing or other tests to confirm a diagnosis. Talk with your health care provider about what your results mean. Questions to ask your health care provider Ask your health care provider, or the department that is doing the test:  When will my results be ready?  How will I get my results?  What are my treatment options?  What other tests do I need?  What are my next steps? Summary  The glucose tolerance test (GTT) is one of several tests used to diagnose diabetes that develops during pregnancy (gestational diabetes mellitus). Gestational diabetes is a temporary form of diabetes that some women develop during pregnancy.  You may have the GTT test after having a 1-hour glucose screening test if the results from that test indicate that you may have gestational diabetes. You may also have this test if you have any symptoms or risk factors for gestational diabetes.  Talk with your health care provider about what your results mean. This information is not intended to replace advice given to you by your health care provider. Make sure you discuss any questions you have with your health care provider. Document Released: 04/23/2012 Document Revised: 02/13/2019 Document Reviewed: 06/04/2017 Elsevier Patient Education  2020 Elsevier Inc.  

## 2019-06-16 NOTE — Progress Notes (Signed)
ROB doing well. Feels good movement. Tdap/BTC/CBC/RPR/Glucose screen today. Sample birth plan given. Discussed birth control after baby. She used pill in the past but is interested in other options. Booklet given. PT encouraged to review and discuss at next appointment. Will follow up with lab results. Return in 2 wks with Melody.   Philip Aspen, CNM

## 2019-06-16 NOTE — Addendum Note (Signed)
Addended by: Raliegh Ip on: 06/16/2019 11:02 AM   Modules accepted: Orders

## 2019-06-17 LAB — RPR: RPR Ser Ql: NONREACTIVE

## 2019-06-17 LAB — CBC
Hematocrit: 34.6 % (ref 34.0–46.6)
Hemoglobin: 12 g/dL (ref 11.1–15.9)
MCH: 29.5 pg (ref 26.6–33.0)
MCHC: 34.7 g/dL (ref 31.5–35.7)
MCV: 85 fL (ref 79–97)
Platelets: 223 10*3/uL (ref 150–450)
RBC: 4.07 x10E6/uL (ref 3.77–5.28)
RDW: 12.3 % (ref 11.7–15.4)
WBC: 11.6 10*3/uL — ABNORMAL HIGH (ref 3.4–10.8)

## 2019-06-17 LAB — GLUCOSE, 1 HOUR GESTATIONAL: Gestational Diabetes Screen: 131 mg/dL (ref 65–139)

## 2019-06-18 ENCOUNTER — Telehealth: Payer: Self-pay | Admitting: General Practice

## 2019-07-01 ENCOUNTER — Encounter: Payer: Medicaid Other | Admitting: Obstetrics and Gynecology

## 2019-07-01 ENCOUNTER — Other Ambulatory Visit: Payer: Self-pay

## 2019-07-01 ENCOUNTER — Ambulatory Visit (INDEPENDENT_AMBULATORY_CARE_PROVIDER_SITE_OTHER): Payer: Medicaid Other | Admitting: Obstetrics and Gynecology

## 2019-07-01 VITALS — BP 128/72 | HR 100 | Wt 157.1 lb

## 2019-07-01 DIAGNOSIS — Z3493 Encounter for supervision of normal pregnancy, unspecified, third trimester: Secondary | ICD-10-CM

## 2019-07-01 LAB — POCT URINALYSIS DIPSTICK OB
Bilirubin, UA: NEGATIVE
Blood, UA: NEGATIVE
Glucose, UA: NEGATIVE
Ketones, UA: NEGATIVE
Leukocytes, UA: NEGATIVE
Nitrite, UA: NEGATIVE
POC,PROTEIN,UA: NEGATIVE
Spec Grav, UA: 1.01 (ref 1.010–1.025)
Urobilinogen, UA: 0.2 E.U./dL
pH, UA: 6 (ref 5.0–8.0)

## 2019-07-01 NOTE — Progress Notes (Signed)
ROB-doing well, some RUQ pain with infant positioning. Discussed enrolling in classes. Plans breast feeding. Vitafol gummie samples given.

## 2019-07-01 NOTE — Progress Notes (Signed)
ROB- pt is doing well 

## 2019-07-02 ENCOUNTER — Encounter: Payer: Self-pay | Admitting: Certified Nurse Midwife

## 2019-07-03 ENCOUNTER — Encounter: Payer: Self-pay | Admitting: Certified Nurse Midwife

## 2019-07-03 ENCOUNTER — Encounter: Payer: Medicaid Other | Admitting: Obstetrics and Gynecology

## 2019-07-15 ENCOUNTER — Ambulatory Visit (INDEPENDENT_AMBULATORY_CARE_PROVIDER_SITE_OTHER): Payer: Medicaid Other | Admitting: Certified Nurse Midwife

## 2019-07-15 ENCOUNTER — Other Ambulatory Visit: Payer: Self-pay

## 2019-07-15 VITALS — BP 105/78 | HR 101 | Wt 158.8 lb

## 2019-07-15 DIAGNOSIS — Z3493 Encounter for supervision of normal pregnancy, unspecified, third trimester: Secondary | ICD-10-CM

## 2019-07-15 LAB — POCT URINALYSIS DIPSTICK OB
Bilirubin, UA: NEGATIVE
Blood, UA: NEGATIVE
Glucose, UA: NEGATIVE
Ketones, UA: NEGATIVE
Nitrite, UA: NEGATIVE
Spec Grav, UA: 1.02 (ref 1.010–1.025)
Urobilinogen, UA: 0.2 E.U./dL
pH, UA: 6.5 (ref 5.0–8.0)

## 2019-07-15 NOTE — Progress Notes (Signed)
ROB-No questions or concerns. Asthma controlled, no changes in mood. Desires epidural, using [facility]. Education regarding WIC and breast pumps, recommended Medela or Spectra.  Anticipatory guidance regarding course of prenatal care. Reviewed red flag symptoms, signs of preterm labor, and when to call. RTC x 2 weeks for ROB or sooner if needed.

## 2019-07-15 NOTE — Progress Notes (Signed)
ROB-No complaints.  

## 2019-07-15 NOTE — Patient Instructions (Addendum)
Fetal Movement Counts Patient Name: ________________________________________________ Patient Due Date: ____________________ What is a fetal movement count?  A fetal movement count is the number of times that you feel your baby move during a certain amount of time. This may also be called a fetal kick count. A fetal movement count is recommended for every pregnant woman. You may be asked to start counting fetal movements as early as week 28 of your pregnancy. Pay attention to when your baby is most active. You may notice your baby's sleep and wake cycles. You may also notice things that make your baby move more. You should do a fetal movement count:  When your baby is normally most active.  At the same time each day. A good time to count movements is while you are resting, after having something to eat and drink. How do I count fetal movements? 1. Find a quiet, comfortable area. Sit, or lie down on your side. 2. Write down the date, the start time and stop time, and the number of movements that you felt between those two times. Take this information with you to your health care visits. 3. For 2 hours, count kicks, flutters, swishes, rolls, and jabs. You should feel at least 10 movements during 2 hours. 4. You may stop counting after you have felt 10 movements. 5. If you do not feel 10 movements in 2 hours, have something to eat and drink. Then, keep resting and counting for 1 hour. If you feel at least 4 movements during that hour, you may stop counting. Contact a health care provider if:  You feel fewer than 4 movements in 2 hours.  Your baby is not moving like he or she usually does. Date: ____________ Start time: ____________ Stop time: ____________ Movements: ____________ Date: ____________ Start time: ____________ Stop time: ____________ Movements: ____________ Date: ____________ Start time: ____________ Stop time: ____________ Movements: ____________ Date: ____________ Start time:  ____________ Stop time: ____________ Movements: ____________ Date: ____________ Start time: ____________ Stop time: ____________ Movements: ____________ Date: ____________ Start time: ____________ Stop time: ____________ Movements: ____________ Date: ____________ Start time: ____________ Stop time: ____________ Movements: ____________ Date: ____________ Start time: ____________ Stop time: ____________ Movements: ____________ Date: ____________ Start time: ____________ Stop time: ____________ Movements: ____________ This information is not intended to replace advice given to you by your health care provider. Make sure you discuss any questions you have with your health care provider. Document Released: 11/22/2006 Document Revised: 11/12/2018 Document Reviewed: 12/02/2015 Elsevier Patient Education  2020 [employer].  [facility]  [place], [place], [place] [address]  Phone: [phone]   [facility] (second location)  [address] [place], [place] [address]  Phone: [phone]   [facility]) [place], [place], [place] [address] Phone: [phone]   [address] [place], [place] [address]  Phone: [phone]

## 2019-07-26 ENCOUNTER — Other Ambulatory Visit: Payer: Self-pay | Admitting: Certified Nurse Midwife

## 2019-07-29 ENCOUNTER — Ambulatory Visit (INDEPENDENT_AMBULATORY_CARE_PROVIDER_SITE_OTHER): Payer: Medicaid Other | Admitting: Certified Nurse Midwife

## 2019-07-29 ENCOUNTER — Other Ambulatory Visit: Payer: Self-pay

## 2019-07-29 ENCOUNTER — Encounter: Payer: Self-pay | Admitting: Certified Nurse Midwife

## 2019-07-29 VITALS — BP 117/68 | HR 84 | Wt 159.4 lb

## 2019-07-29 DIAGNOSIS — Z23 Encounter for immunization: Secondary | ICD-10-CM

## 2019-07-29 DIAGNOSIS — Z3403 Encounter for supervision of normal first pregnancy, third trimester: Secondary | ICD-10-CM

## 2019-07-29 LAB — POCT URINALYSIS DIPSTICK OB
Bilirubin, UA: NEGATIVE
Blood, UA: NEGATIVE
Glucose, UA: NEGATIVE
Ketones, UA: NEGATIVE
Leukocytes, UA: NEGATIVE
Nitrite, UA: NEGATIVE
POC,PROTEIN,UA: NEGATIVE
Spec Grav, UA: 1.015 (ref 1.010–1.025)
Urobilinogen, UA: 0.2 E.U./dL
pH, UA: 6 (ref 5.0–8.0)

## 2019-07-29 MED ORDER — VALACYCLOVIR HCL 1 G PO TABS: 1000.0000 mg | ORAL_TABLET | Freq: Every day | ORAL | 2 refills | Status: DC

## 2019-07-29 NOTE — Progress Notes (Signed)
ROB doing well. Feels good movement. Discussed GBS and cultures at next visit. Order placed for valtrex, pt instructed to take once daily starting at 36 wks. She verbalizes and agrees to plan. Follow up 2 wks.   Philip Aspen, CNM

## 2019-07-29 NOTE — Patient Instructions (Signed)
Braxton Hicks Contractions Contractions of the uterus can occur throughout pregnancy, but they are not always a sign that you are in labor. You may have practice contractions called Braxton Hicks contractions. These false labor contractions are sometimes confused with true labor. What are Braxton Hicks contractions? Braxton Hicks contractions are tightening movements that occur in the muscles of the uterus before labor. Unlike true labor contractions, these contractions do not result in opening (dilation) and thinning of the cervix. Toward the end of pregnancy (32-34 weeks), Braxton Hicks contractions can happen more often and may become stronger. These contractions are sometimes difficult to tell apart from true labor because they can be very uncomfortable. You should not feel embarrassed if you go to the hospital with false labor. Sometimes, the only way to tell if you are in true labor is for your health care provider to look for changes in the cervix. The health care provider will do a physical exam and may monitor your contractions. If you are not in true labor, the exam should show that your cervix is not dilating and your water has not broken. If there are no other health problems associated with your pregnancy, it is completely safe for you to be sent home with false labor. You may continue to have Braxton Hicks contractions until you go into true labor. How to tell the difference between true labor and false labor True labor  Contractions last 30-70 seconds.  Contractions become very regular.  Discomfort is usually felt in the top of the uterus, and it spreads to the lower abdomen and low back.  Contractions do not go away with walking.  Contractions usually become more intense and increase in frequency.  The cervix dilates and gets thinner. False labor  Contractions are usually shorter and not as strong as true labor contractions.  Contractions are usually irregular.  Contractions  are often felt in the front of the lower abdomen and in the groin.  Contractions may go away when you walk around or change positions while lying down.  Contractions get weaker and are shorter-lasting as time goes on.  The cervix usually does not dilate or become thin. Follow these instructions at home:   Take over-the-counter and prescription medicines only as told by your health care provider.  Keep up with your usual exercises and follow other instructions from your health care provider.  Eat and drink lightly if you think you are going into labor.  If Braxton Hicks contractions are making you uncomfortable: ? Change your position from lying down or resting to walking, or change from walking to resting. ? Sit and rest in a tub of warm water. ? Drink enough fluid to keep your urine pale yellow. Dehydration may cause these contractions. ? Do slow and deep breathing several times an hour.  Keep all follow-up prenatal visits as told by your health care provider. This is important. Contact a health care provider if:  You have a fever.  You have continuous pain in your abdomen. Get help right away if:  Your contractions become stronger, more regular, and closer together.  You have fluid leaking or gushing from your vagina.  You pass blood-tinged mucus (bloody show).  You have bleeding from your vagina.  You have low back pain that you never had before.  You feel your baby's head pushing down and causing pelvic pressure.  Your baby is not moving inside you as much as it used to. Summary  Contractions that occur before labor are   called Braxton Hicks contractions, false labor, or practice contractions.  Braxton Hicks contractions are usually shorter, weaker, farther apart, and less regular than true labor contractions. True labor contractions usually become progressively stronger and regular, and they become more frequent.  Manage discomfort from Braxton Hicks contractions  by changing position, resting in a warm bath, drinking plenty of water, or practicing deep breathing. This information is not intended to replace advice given to you by your health care provider. Make sure you discuss any questions you have with your health care provider. Document Released: 03/08/2017 Document Revised: 10/05/2017 Document Reviewed: 03/08/2017 Elsevier Patient Education  2020 Elsevier Inc.  

## 2019-08-12 ENCOUNTER — Ambulatory Visit (INDEPENDENT_AMBULATORY_CARE_PROVIDER_SITE_OTHER): Payer: Medicaid Other | Admitting: Obstetrics and Gynecology

## 2019-08-12 ENCOUNTER — Other Ambulatory Visit: Payer: Self-pay

## 2019-08-12 VITALS — BP 112/74 | HR 101 | Wt 160.9 lb

## 2019-08-12 DIAGNOSIS — Z113 Encounter for screening for infections with a predominantly sexual mode of transmission: Secondary | ICD-10-CM

## 2019-08-12 DIAGNOSIS — Z3493 Encounter for supervision of normal pregnancy, unspecified, third trimester: Secondary | ICD-10-CM

## 2019-08-12 DIAGNOSIS — Z3685 Encounter for antenatal screening for Streptococcus B: Secondary | ICD-10-CM

## 2019-08-12 LAB — POCT URINALYSIS DIPSTICK OB
Bilirubin, UA: NEGATIVE
Blood, UA: NEGATIVE
Glucose, UA: NEGATIVE
Ketones, UA: NEGATIVE
Leukocytes, UA: NEGATIVE
Nitrite, UA: NEGATIVE
POC,PROTEIN,UA: NEGATIVE
Spec Grav, UA: 1.015 (ref 1.010–1.025)
Urobilinogen, UA: 0.2 E.U./dL
pH, UA: 6 (ref 5.0–8.0)

## 2019-08-12 NOTE — Progress Notes (Signed)
ROB- cultures obtained, pt is having pelvic pressure 

## 2019-08-12 NOTE — Progress Notes (Signed)
ROB & cultures- still needs varicella titer.to start valtrex if hasn't already. FOB and her are not together and she does not want him at hospital, mother for support person. Discussed directory. Encouraged [employer]- will consider.

## 2019-08-14 LAB — GC/CHLAMYDIA PROBE AMP
Chlamydia trachomatis, NAA: NEGATIVE
Neisseria Gonorrhoeae by PCR: NEGATIVE

## 2019-08-14 LAB — STREP GP B NAA+RFLX: Strep Gp B NAA+Rflx: NEGATIVE

## 2019-08-19 ENCOUNTER — Other Ambulatory Visit: Payer: Self-pay

## 2019-08-19 ENCOUNTER — Ambulatory Visit (INDEPENDENT_AMBULATORY_CARE_PROVIDER_SITE_OTHER): Payer: Medicaid Other | Admitting: Certified Nurse Midwife

## 2019-08-19 VITALS — BP 113/75 | HR 92 | Wt 163.2 lb

## 2019-08-19 DIAGNOSIS — Z3493 Encounter for supervision of normal pregnancy, unspecified, third trimester: Secondary | ICD-10-CM

## 2019-08-19 LAB — POCT URINALYSIS DIPSTICK OB
Bilirubin, UA: NEGATIVE
Blood, UA: NEGATIVE
Glucose, UA: NEGATIVE
Ketones, UA: NEGATIVE
Leukocytes, UA: NEGATIVE
Nitrite, UA: NEGATIVE
POC,PROTEIN,UA: NEGATIVE
Spec Grav, UA: 1.01 (ref 1.010–1.025)
Urobilinogen, UA: 0.2 E.U./dL
pH, UA: 7 (ref 5.0–8.0)

## 2019-08-19 NOTE — Patient Instructions (Signed)
Vaginal Delivery  Vaginal delivery means that you give birth by pushing your baby out of your birth canal (vagina). A team of health care providers will help you before, during, and after vaginal delivery. Birth experiences are unique for every woman and every pregnancy, and birth experiences vary depending on where you choose to give birth. What happens when I arrive at the birth center or hospital? Once you are in labor and have been admitted into the hospital or birth center, your health care provider may:  Review your pregnancy history and any concerns that you have.  Insert an IV into one of your veins. This may be used to give you fluids and medicines.  Check your blood pressure, pulse, temperature, and heart rate (vital signs).  Check whether your bag of water (amniotic sac) has broken (ruptured).  Talk with you about your birth plan and discuss pain control options. Monitoring Your health care provider may monitor your contractions (uterine monitoring) and your baby's heart rate (fetal monitoring). You may need to be monitored:  Often, but not continuously (intermittently).  All the time or for long periods at a time (continuously). Continuous monitoring may be needed if: ? You are taking certain medicines, such as medicine to relieve pain or make your contractions stronger. ? You have pregnancy or labor complications. Monitoring may be done by:  Placing a special stethoscope or a handheld monitoring device on your abdomen to check your baby's heartbeat and to check for contractions.  Placing monitors on your abdomen (external monitors) to record your baby's heartbeat and the frequency and length of contractions.  Placing monitors inside your uterus through your vagina (internal monitors) to record your baby's heartbeat and the frequency, length, and strength of your contractions. Depending on the type of monitor, it may remain in your uterus or on your baby's head until birth.   Telemetry. This is a type of continuous monitoring that can be done with external or internal monitors. Instead of having to stay in bed, you are able to move around during telemetry. Physical exam Your health care provider may perform frequent physical exams. This may include:  Checking how and where your baby is positioned in your uterus.  Checking your cervix to determine: ? Whether it is thinning out (effacing). ? Whether it is opening up (dilating). What happens during labor and delivery?  Normal labor and delivery is divided into the following three stages: Stage 1  This is the longest stage of labor.  This stage can last for hours or days.  Throughout this stage, you will feel contractions. Contractions generally feel mild, infrequent, and irregular at first. They get stronger, more frequent (about every 2-3 minutes), and more regular as you move through this stage.  This stage ends when your cervix is completely dilated to 4 inches (10 cm) and completely effaced. Stage 2  This stage starts once your cervix is completely effaced and dilated and lasts until the delivery of your baby.  This stage may last from 20 minutes to 2 hours.  This is the stage where you will feel an urge to push your baby out of your vagina.  You may feel stretching and burning pain, especially when the widest part of your baby's head passes through the vaginal opening (crowning).  Once your baby is delivered, the umbilical cord will be clamped and cut. This usually occurs after waiting a period of 1-2 minutes after delivery.  Your baby will be placed on your bare chest (  skin-to-skin contact) in an upright position and covered with a warm blanket. Watch your baby for feeding cues, like rooting or sucking, and help the baby to your breast for his or her first feeding. Stage 3  This stage starts immediately after the birth of your baby and ends after you deliver the placenta.  This stage may take  anywhere from 5 to 30 minutes.  After your baby has been delivered, you will feel contractions as your body expels the placenta and your uterus contracts to control bleeding. What can I expect after labor and delivery?  After labor is over, you and your baby will be monitored closely until you are ready to go home to ensure that you are both healthy. Your health care team will teach you how to care for yourself and your baby.  You and your baby will stay in the same room (rooming in) during your hospital stay. This will encourage early bonding and successful breastfeeding.  You may continue to receive fluids and medicines through an IV.  Your uterus will be checked and massaged regularly (fundal massage).  You will have some soreness and pain in your abdomen, vagina, and the area of skin between your vaginal opening and your anus (perineum).  If an incision was made near your vagina (episiotomy) or if you had some vaginal tearing during delivery, cold compresses may be placed on your episiotomy or your tear. This helps to reduce pain and swelling.  You may be given a squirt bottle to use instead of wiping when you go to the bathroom. To use the squirt bottle, follow these steps: ? Before you urinate, fill the squirt bottle with warm water. Do not use hot water. ? After you urinate, while you are sitting on the toilet, use the squirt bottle to rinse the area around your urethra and vaginal opening. This rinses away any urine and blood. ? Fill the squirt bottle with clean water every time you use the bathroom.  It is normal to have vaginal bleeding after delivery. Wear a sanitary pad for vaginal bleeding and discharge. Summary  Vaginal delivery means that you will give birth by pushing your baby out of your birth canal (vagina).  Your health care provider may monitor your contractions (uterine monitoring) and your baby's heart rate (fetal monitoring).  Your health care provider may perform  a physical exam.  Normal labor and delivery is divided into three stages.  After labor is over, you and your baby will be monitored closely until you are ready to go home. This information is not intended to replace advice given to you by your health care provider. Make sure you discuss any questions you have with your health care provider. Document Released: 08/01/2008 Document Revised: 11/27/2017 Document Reviewed: 11/27/2017 Elsevier Patient Education  2020 [employer] Inc[employer] Fetal Movement Counts Patient Name: ________________________________________________ Patient Due Date: ____________________ What is a fetal movement count?  A fetal movement count is the number of times that you feel your baby move during a certain amount of time. This may also be called a fetal kick count. A fetal movement count is recommended for every pregnant woman. You may be asked to start counting fetal movements as early as week 28 of your pregnancy. Pay attention to when your baby is most active. You may notice your baby's sleep and wake cycles. You may also notice things that make your baby move more. You should do a fetal movement count:  When your baby is normally most   active.  At the same time each day. A good time to count movements is while you are resting, after having something to eat and drink. How do I count fetal movements? 1. Find a quiet, comfortable area. Sit, or lie down on your side. 2. Write down the date, the start time and stop time, and the number of movements that you felt between those two times. Take this information with you to your health care visits. 3. For 2 hours, count kicks, flutters, swishes, rolls, and jabs. You should feel at least 10 movements during 2 hours. 4. You may stop counting after you have felt 10 movements. 5. If you do not feel 10 movements in 2 hours, have something to eat and drink. Then, keep resting and counting for 1 hour. If you feel at least 4 movements during  that hour, you may stop counting. Contact a health care provider if:  You feel fewer than 4 movements in 2 hours.  Your baby is not moving like he or she usually does. Date: ____________ Start time: ____________ Stop time: ____________ Movements: ____________ Date: ____________ Start time: ____________ Stop time: ____________ Movements: ____________ Date: ____________ Start time: ____________ Stop time: ____________ Movements: ____________ Date: ____________ Start time: ____________ Stop time: ____________ Movements: ____________ Date: ____________ Start time: ____________ Stop time: ____________ Movements: ____________ Date: ____________ Start time: ____________ Stop time: ____________ Movements: ____________ Date: ____________ Start time: ____________ Stop time: ____________ Movements: ____________ Date: ____________ Start time: ____________ Stop time: ____________ Movements: ____________ Date: ____________ Start time: ____________ Stop time: ____________ Movements: ____________ This information is not intended to replace advice given to you by your health care provider. Make sure you discuss any questions you have with your health care provider. Document Released: 11/22/2006 Document Revised: 11/12/2018 Document Reviewed: 12/02/2015 Elsevier Patient Education  2020 [employer].

## 2019-08-19 NOTE — Progress Notes (Signed)
ROB-No complaints.  

## 2019-08-19 NOTE — Progress Notes (Signed)
ROB-Doing well, no questions or concerns. Taking Valtrex daily. Discussed GBS negative status, verbalized understanding. [place] given, advised to contact today. Anticipatory guidance regarding course of prenatal care. Reviewed red flag symptoms and when to call. RTC x 1 week for ROB or sooner if needed.

## 2019-08-26 ENCOUNTER — Other Ambulatory Visit: Payer: Self-pay

## 2019-08-26 ENCOUNTER — Ambulatory Visit (INDEPENDENT_AMBULATORY_CARE_PROVIDER_SITE_OTHER): Payer: Medicaid Other | Admitting: Certified Nurse Midwife

## 2019-08-26 VITALS — BP 124/79 | HR 86 | Wt 164.5 lb

## 2019-08-26 DIAGNOSIS — Z3493 Encounter for supervision of normal pregnancy, unspecified, third trimester: Secondary | ICD-10-CM

## 2019-08-26 DIAGNOSIS — Z3A38 38 weeks gestation of pregnancy: Secondary | ICD-10-CM

## 2019-08-26 LAB — POCT URINALYSIS DIPSTICK OB
Bilirubin, UA: NEGATIVE
Blood, UA: NEGATIVE
Glucose, UA: NEGATIVE
Ketones, UA: NEGATIVE
Leukocytes, UA: NEGATIVE
Nitrite, UA: NEGATIVE
POC,PROTEIN,UA: NEGATIVE
Spec Grav, UA: 1.01 (ref 1.010–1.025)
Urobilinogen, UA: 0.2 E.U./dL
pH, UA: 5 (ref 5.0–8.0)

## 2019-08-26 NOTE — Patient Instructions (Signed)
Braxton Hicks Contractions Contractions of the uterus can occur throughout pregnancy, but they are not always a sign that you are in labor. You may have practice contractions called Braxton Hicks contractions. These false labor contractions are sometimes confused with true labor. What are Braxton Hicks contractions? Braxton Hicks contractions are tightening movements that occur in the muscles of the uterus before labor. Unlike true labor contractions, these contractions do not result in opening (dilation) and thinning of the cervix. Toward the end of pregnancy (32-34 weeks), Braxton Hicks contractions can happen more often and may become stronger. These contractions are sometimes difficult to tell apart from true labor because they can be very uncomfortable. You should not feel embarrassed if you go to the hospital with false labor. Sometimes, the only way to tell if you are in true labor is for your health care provider to look for changes in the cervix. The health care provider will do a physical exam and may monitor your contractions. If you are not in true labor, the exam should show that your cervix is not dilating and your water has not broken. If there are no other health problems associated with your pregnancy, it is completely safe for you to be sent home with false labor. You may continue to have Braxton Hicks contractions until you go into true labor. How to tell the difference between true labor and false labor True labor  Contractions last 30-70 seconds.  Contractions become very regular.  Discomfort is usually felt in the top of the uterus, and it spreads to the lower abdomen and low back.  Contractions do not go away with walking.  Contractions usually become more intense and increase in frequency.  The cervix dilates and gets thinner. False labor  Contractions are usually shorter and not as strong as true labor contractions.  Contractions are usually irregular.  Contractions  are often felt in the front of the lower abdomen and in the groin.  Contractions may go away when you walk around or change positions while lying down.  Contractions get weaker and are shorter-lasting as time goes on.  The cervix usually does not dilate or become thin. Follow these instructions at home:   Take over-the-counter and prescription medicines only as told by your health care provider.  Keep up with your usual exercises and follow other instructions from your health care provider.  Eat and drink lightly if you think you are going into labor.  If Braxton Hicks contractions are making you uncomfortable: ? Change your position from lying down or resting to walking, or change from walking to resting. ? Sit and rest in a tub of warm water. ? Drink enough fluid to keep your urine pale yellow. Dehydration may cause these contractions. ? Do slow and deep breathing several times an hour.  Keep all follow-up prenatal visits as told by your health care provider. This is important. Contact a health care provider if:  You have a fever.  You have continuous pain in your abdomen. Get help right away if:  Your contractions become stronger, more regular, and closer together.  You have fluid leaking or gushing from your vagina.  You pass blood-tinged mucus (bloody show).  You have bleeding from your vagina.  You have low back pain that you never had before.  You feel your baby's head pushing down and causing pelvic pressure.  Your baby is not moving inside you as much as it used to. Summary  Contractions that occur before labor are   called Braxton Hicks contractions, false labor, or practice contractions.  Braxton Hicks contractions are usually shorter, weaker, farther apart, and less regular than true labor contractions. True labor contractions usually become progressively stronger and regular, and they become more frequent.  Manage discomfort from Braxton Hicks contractions  by changing position, resting in a warm bath, drinking plenty of water, or practicing deep breathing. This information is not intended to replace advice given to you by your health care provider. Make sure you discuss any questions you have with your health care provider. Document Released: 03/08/2017 Document Revised: 10/05/2017 Document Reviewed: 03/08/2017 Elsevier Patient Education  2020 Elsevier Inc.  

## 2019-08-26 NOTE — Progress Notes (Signed)
ROB doing well.Feels good movement. Labor precautions reviewed. Follow up 1 wk with Melody .   Philip Aspen, CNM

## 2019-08-27 LAB — VARICELLA ZOSTER ANTIBODY, IGG: Varicella zoster IgG: 826 index (ref >165)

## 2019-09-02 ENCOUNTER — Other Ambulatory Visit: Payer: Self-pay

## 2019-09-02 ENCOUNTER — Ambulatory Visit (INDEPENDENT_AMBULATORY_CARE_PROVIDER_SITE_OTHER): Payer: Medicaid Other | Admitting: Certified Nurse Midwife

## 2019-09-02 VITALS — BP 112/71 | HR 93 | Wt 166.3 lb

## 2019-09-02 DIAGNOSIS — O480 Post-term pregnancy: Secondary | ICD-10-CM

## 2019-09-02 DIAGNOSIS — Z3A40 40 weeks gestation of pregnancy: Secondary | ICD-10-CM

## 2019-09-02 DIAGNOSIS — Z3493 Encounter for supervision of normal pregnancy, unspecified, third trimester: Secondary | ICD-10-CM

## 2019-09-02 LAB — POCT URINALYSIS DIPSTICK OB
Bilirubin, UA: NEGATIVE
Blood, UA: NEGATIVE
Glucose, UA: NEGATIVE
Ketones, UA: NEGATIVE
Nitrite, UA: NEGATIVE
POC,PROTEIN,UA: NEGATIVE
Spec Grav, UA: 1.01 (ref 1.010–1.025)
Urobilinogen, UA: 0.2 E.U./dL
pH, UA: 6 (ref 5.0–8.0)

## 2019-09-02 NOTE — Progress Notes (Signed)
ROB-Reports intermittent pelvic and back pain starting this AM. Requests SVE, unchanged since previous exam. Discussed home labor preparation techniques. Anticipatory guidance regarding postdates care. Reviewed red flag symptoms, signs of labor, and when to call. RTC x Monday for NST and ROB, then RTC x Thursday for growth ultrasound/AFI and ROB or sooner if needed.

## 2019-09-02 NOTE — Progress Notes (Signed)
ROB-Patient c/o lower pelvic pressure and lower back pain that started this morning.

## 2019-09-02 NOTE — Patient Instructions (Addendum)
Labor Induction  Labor induction is when steps are taken to cause a pregnant woman to begin the labor process. Most women go into labor on their own between 37 weeks and 42 weeks of pregnancy. When this does not happen or when there is a medical need for labor to begin, steps may be taken to induce labor. Labor induction causes a pregnant woman's uterus to contract. It also causes the cervix to soften (ripen), open (dilate), and thin out (efface). Usually, labor is not induced before 39 weeks of pregnancy unless there is a medical reason to do so. Your health care provider will determine if labor induction is needed. Before inducing labor, your health care provider will consider a number of factors, including:  Your medical condition and your baby's.  How many weeks along you are in your pregnancy.  How mature your baby's lungs are.  The condition of your cervix.  The position of your baby.  The size of your birth canal. What are some reasons for labor induction? Labor may be induced if:  Your health or your baby's health is at risk.  Your pregnancy is overdue by 1 week or more.  Your water breaks but labor does not start on its own.  There is a low amount of amniotic fluid around your baby. You may also choose (elect) to have labor induced at a certain time. Generally, elective labor induction is done no earlier than 39 weeks of pregnancy. What methods are used for labor induction? Methods used for labor induction include:  Prostaglandin medicine. This medicine starts contractions and causes the cervix to dilate and ripen. It can be taken by mouth (orally) or by being inserted into the vagina (suppository).  Inserting a small, thin tube (catheter) with a balloon into the vagina and then expanding the balloon with water to dilate the cervix.  Stripping the membranes. In this method, your health care provider gently separates amniotic sac tissue from the cervix. This causes the  cervix to stretch, which in turn causes the release of a hormone called progesterone. The hormone causes the uterus to contract. This procedure is often done during an office visit, after which you will be sent home to wait for contractions to begin.  Breaking the water. In this method, your health care provider uses a small instrument to make a small hole in the amniotic sac. This eventually causes the amniotic sac to break. Contractions should begin after a few hours.  Medicine to trigger or strengthen contractions. This medicine is given through an IV that is inserted into a vein in your arm. Except for membrane stripping, which can be done in a clinic, labor induction is done in the hospital so that you and your baby can be carefully monitored. How long does it take for labor to be induced? The length of time it takes to induce labor depends on how ready your body is for labor. Some inductions can take up to 2-3 days, while others may take less than a day. Induction may take longer if:  You are induced early in your pregnancy.  It is your first pregnancy.  Your cervix is not ready. What are some risks associated with labor induction? Some risks associated with labor induction include:  Changes in fetal heart rate, such as being too high, too low, or irregular (erratic).  Failed induction.  Infection in the mother or the baby.  Increased risk of having a cesarean delivery.  Fetal death.  Breaking off (  abruption) of the placenta from the uterus (rare).  Rupture of the uterus (very rare). When induction is needed for medical reasons, the benefits of induction generally outweigh the risks. What are some reasons for not inducing labor? Labor induction should not be done if:  Your baby does not tolerate contractions.  You have had previous surgeries on your uterus, such as a myomectomy, removal of fibroids, or a vertical scar from a previous cesarean delivery.  Your placenta lies  very low in your uterus and blocks the opening of the cervix (placenta previa).  Your baby is not in a head-down position.  The umbilical cord drops down into the birth canal in front of the baby.  There are unusual circumstances, such as the baby being very early (premature).  You have had more than 2 previous cesarean deliveries. Summary  Labor induction is when steps are taken to cause a pregnant woman to begin the labor process.  Labor induction causes a pregnant woman's uterus to contract. It also causes the cervix to ripen, dilate, and efface.  Labor is not induced before 39 weeks of pregnancy unless there is a medical reason to do so.  When induction is needed for medical reasons, the benefits of induction generally outweigh the risks. This information is not intended to replace advice given to you by your health care provider. Make sure you discuss any questions you have with your health care provider. Document Released: 03/14/2007 Document Revised: 10/26/2017 Document Reviewed: 12/06/2016 Elsevier Patient Education  2020 Elsevier Inc[employer]  Nonstress Test A nonstress test is a procedure that is done during pregnancy in order to check the baby's heartbeat. The procedure can help show if the baby (fetus) is healthy. It is commonly done when:  The baby is past his or her due date.  The pregnancy is high risk.  The baby is moving less than normal.  The mother has lost a pregnancy in the past.  The health care provider suspects a problem with the baby's growth.  There is too much or too little amniotic fluid. The procedure is often done in the third trimester of pregnancy to find out if an early delivery is needed and whether such a delivery is safe. During a nonstress test, the baby's heartbeat is monitored when the baby is resting and when the baby is moving. If the baby is healthy, the heart rate will increase when he or she moves or kicks and will return to normal when he or  she rests. Tell a health care provider about:  Any allergies you have.  Any medical conditions you have.  All medicines you are taking, including vitamins, herbs, eye drops, creams, and over-the-counter medicines. What are the risks? There are no risks to you or your baby from a nonstress test. This procedure should not be painful or uncomfortable. What happens before the procedure?  Eat a meal right before the test or as directed by your health care provider. Food may help encourage the baby to move.  Use the restroom right before the test. What happens during the procedure?  Two monitors will be placed on your abdomen. One will record the baby's heart rate and the other will record the contractions of your uterus.  You may be asked to lie down on your side or to sit upright.  You may be given a button to press when you feel your baby move.  Your health care provider will listen to your baby's heartbeat and recorded it. He  or she may also watch your baby's heartbeat on a screen.  If the baby seems to be sleeping, you may be asked to drink some juice or soda, eat a snack, or change positions. The procedure may vary among health care providers and hospitals. What happens after the procedure?  Your health care provider will discuss the test results with you and make recommendations for the future. Depending on the results, your health care provider may order additional tests or another course of action.  If your health care provider gave you any diet or activity instructions, make sure to follow them.  Keep all follow-up visits as told by your health care provider. This is important. Summary  A nonstress test is a procedure that is done during pregnancy in order to check the baby's heartbeat. The procedure can help show if the baby is healthy.  The procedure is often done in the third trimester of pregnancy to find out if an early delivery is needed and whether such a delivery is  safe.  During a nonstress test, the baby's heartbeat is monitored when the baby is resting and when the baby is moving. If the baby is healthy, the heart rate will increase when he or she moves or kicks and will return to normal when he or she rests.  Your health care provider will discuss the test results with you and make recommendations for the future. This information is not intended to replace advice given to you by your health care provider. Make sure you discuss any questions you have with your health care provider. Document Released: 10/13/2002 Document Revised: 02/01/2017 Document Reviewed: 02/01/2017 Elsevier Patient Education  2020 Elsevier Inc.   Fetal Movement Counts Patient Name: ________________________________________________ Patient Due Date: ____________________ What is a fetal movement count?  A fetal movement count is the number of times that you feel your baby move during a certain amount of time. This may also be called a fetal kick count. A fetal movement count is recommended for every pregnant woman. You may be asked to start counting fetal movements as early as week 28 of your pregnancy. Pay attention to when your baby is most active. You may notice your baby's sleep and wake cycles. You may also notice things that make your baby move more. You should do a fetal movement count:  When your baby is normally most active.  At the same time each day. A good time to count movements is while you are resting, after having something to eat and drink. How do I count fetal movements? 1. Find a quiet, comfortable area. Sit, or lie down on your side. 2. Write down the date, the start time and stop time, and the number of movements that you felt between those two times. Take this information with you to your health care visits. 3. For 2 hours, count kicks, flutters, swishes, rolls, and jabs. You should feel at least 10 movements during 2 hours. 4. You may stop counting after you  have felt 10 movements. 5. If you do not feel 10 movements in 2 hours, have something to eat and drink. Then, keep resting and counting for 1 hour. If you feel at least 4 movements during that hour, you may stop counting. Contact a health care provider if:  You feel fewer than 4 movements in 2 hours.  Your baby is not moving like he or she usually does. Date: ____________ Start time: ____________ Stop time: ____________ Movements: ____________ Date: ____________ Start time: ____________  Stop time: ____________ Movements: ____________ Date: ____________ Start time: ____________ Stop time: ____________ Movements: ____________ Date: ____________ Start time: ____________ Stop time: ____________ Movements: ____________ Date: ____________ Start time: ____________ Stop time: ____________ Movements: ____________ Date: ____________ Start time: ____________ Stop time: ____________ Movements: ____________ Date: ____________ Start time: ____________ Stop time: ____________ Movements: ____________ Date: ____________ Start time: ____________ Stop time: ____________ Movements: ____________ Date: ____________ Start time: ____________ Stop time: ____________ Movements: ____________ This information is not intended to replace advice given to you by your health care provider. Make sure you discuss any questions you have with your health care provider. Document Released: 11/22/2006 Document Revised: 11/12/2018 Document Reviewed: 12/02/2015 Elsevier Patient Education  2020 [employer].

## 2019-09-08 ENCOUNTER — Ambulatory Visit (INDEPENDENT_AMBULATORY_CARE_PROVIDER_SITE_OTHER): Payer: Medicaid Other | Admitting: Certified Nurse Midwife

## 2019-09-08 ENCOUNTER — Encounter: Payer: Self-pay | Admitting: Certified Nurse Midwife

## 2019-09-08 ENCOUNTER — Other Ambulatory Visit: Payer: Medicaid Other

## 2019-09-08 ENCOUNTER — Other Ambulatory Visit: Payer: Self-pay

## 2019-09-08 VITALS — BP 121/72 | Wt 170.2 lb

## 2019-09-08 DIAGNOSIS — O480 Post-term pregnancy: Secondary | ICD-10-CM | POA: Diagnosis not present

## 2019-09-08 DIAGNOSIS — Z3493 Encounter for supervision of normal pregnancy, unspecified, third trimester: Secondary | ICD-10-CM

## 2019-09-08 DIAGNOSIS — Z3A40 40 weeks gestation of pregnancy: Secondary | ICD-10-CM

## 2019-09-08 LAB — POCT URINALYSIS DIPSTICK OB
Bilirubin, UA: NEGATIVE
Glucose, UA: NEGATIVE
Ketones, UA: NEGATIVE
Leukocytes, UA: NEGATIVE
Nitrite, UA: NEGATIVE
POC,PROTEIN,UA: NEGATIVE
Spec Grav, UA: 1.02 (ref 1.010–1.025)
Urobilinogen, UA: 0.2 E.U./dL
pH, UA: 5 (ref 5.0–8.0)

## 2019-09-08 NOTE — Addendum Note (Signed)
Addended by: Hildred Priest on: 09/08/2019 12:11 PM   Modules accepted: Orders, SmartSet

## 2019-09-08 NOTE — Patient Instructions (Signed)
Braxton Hicks Contractions Contractions of the uterus can occur throughout pregnancy, but they are not always a sign that you are in labor. You may have practice contractions called Braxton Hicks contractions. These false labor contractions are sometimes confused with true labor. What are Braxton Hicks contractions? Braxton Hicks contractions are tightening movements that occur in the muscles of the uterus before labor. Unlike true labor contractions, these contractions do not result in opening (dilation) and thinning of the cervix. Toward the end of pregnancy (32-34 weeks), Braxton Hicks contractions can happen more often and may become stronger. These contractions are sometimes difficult to tell apart from true labor because they can be very uncomfortable. You should not feel embarrassed if you go to the hospital with false labor. Sometimes, the only way to tell if you are in true labor is for your health care provider to look for changes in the cervix. The health care provider will do a physical exam and may monitor your contractions. If you are not in true labor, the exam should show that your cervix is not dilating and your water has not broken. If there are no other health problems associated with your pregnancy, it is completely safe for you to be sent home with false labor. You may continue to have Braxton Hicks contractions until you go into true labor. How to tell the difference between true labor and false labor True labor  Contractions last 30-70 seconds.  Contractions become very regular.  Discomfort is usually felt in the top of the uterus, and it spreads to the lower abdomen and low back.  Contractions do not go away with walking.  Contractions usually become more intense and increase in frequency.  The cervix dilates and gets thinner. False labor  Contractions are usually shorter and not as strong as true labor contractions.  Contractions are usually irregular.  Contractions  are often felt in the front of the lower abdomen and in the groin.  Contractions may go away when you walk around or change positions while lying down.  Contractions get weaker and are shorter-lasting as time goes on.  The cervix usually does not dilate or become thin. Follow these instructions at home:   Take over-the-counter and prescription medicines only as told by your health care provider.  Keep up with your usual exercises and follow other instructions from your health care provider.  Eat and drink lightly if you think you are going into labor.  If Braxton Hicks contractions are making you uncomfortable: ? Change your position from lying down or resting to walking, or change from walking to resting. ? Sit and rest in a tub of warm water. ? Drink enough fluid to keep your urine pale yellow. Dehydration may cause these contractions. ? Do slow and deep breathing several times an hour.  Keep all follow-up prenatal visits as told by your health care provider. This is important. Contact a health care provider if:  You have a fever.  You have continuous pain in your abdomen. Get help right away if:  Your contractions become stronger, more regular, and closer together.  You have fluid leaking or gushing from your vagina.  You pass blood-tinged mucus (bloody show).  You have bleeding from your vagina.  You have low back pain that you never had before.  You feel your baby's head pushing down and causing pelvic pressure.  Your baby is not moving inside you as much as it used to. Summary  Contractions that occur before labor are   called Braxton Hicks contractions, false labor, or practice contractions.  Braxton Hicks contractions are usually shorter, weaker, farther apart, and less regular than true labor contractions. True labor contractions usually become progressively stronger and regular, and they become more frequent.  Manage discomfort from Braxton Hicks contractions  by changing position, resting in a warm bath, drinking plenty of water, or practicing deep breathing. This information is not intended to replace advice given to you by your health care provider. Make sure you discuss any questions you have with your health care provider. Document Released: 03/08/2017 Document Revised: 10/05/2017 Document Reviewed: 03/08/2017 Elsevier Patient Education  2020 Elsevier Inc.  

## 2019-09-08 NOTE — Progress Notes (Signed)
ROB and NSt today. Reactive NST baseline 135, accelerations present, variable x 1 while on back.Moderate variability 10-15 sec. Irregular contractions. Pt to return on Thursday for u/s and ROB. Discussed induction. Will schedule for Friday or Monday. Reviewed covid testing.

## 2019-09-09 ENCOUNTER — Other Ambulatory Visit
Admission: RE | Admit: 2019-09-09 | Discharge: 2019-09-09 | Disposition: A | Discharge status: Home / Self Care | Payer: Medicaid Other | Source: Ambulatory Visit | Attending: Obstetrics and Gynecology | Admitting: Obstetrics and Gynecology

## 2019-09-09 DIAGNOSIS — Z20828 Contact with and (suspected) exposure to other viral communicable diseases: Secondary | ICD-10-CM | POA: Diagnosis not present

## 2019-09-09 DIAGNOSIS — Z01812 Encounter for preprocedural laboratory examination: Secondary | ICD-10-CM | POA: Insufficient documentation

## 2019-09-10 LAB — SARS CORONAVIRUS 2 (TAT 6-24 HRS): SARS Coronavirus 2: NEGATIVE

## 2019-09-11 ENCOUNTER — Other Ambulatory Visit: Payer: Self-pay

## 2019-09-11 ENCOUNTER — Ambulatory Visit (INDEPENDENT_AMBULATORY_CARE_PROVIDER_SITE_OTHER): Payer: Medicaid Other | Admitting: Certified Nurse Midwife

## 2019-09-11 ENCOUNTER — Ambulatory Visit (INDEPENDENT_AMBULATORY_CARE_PROVIDER_SITE_OTHER): Payer: Medicaid Other

## 2019-09-11 VITALS — BP 113/84 | HR 79 | Wt 169.3 lb

## 2019-09-11 DIAGNOSIS — Z3A40 40 weeks gestation of pregnancy: Secondary | ICD-10-CM

## 2019-09-11 DIAGNOSIS — Z3493 Encounter for supervision of normal pregnancy, unspecified, third trimester: Secondary | ICD-10-CM

## 2019-09-11 DIAGNOSIS — O480 Post-term pregnancy: Secondary | ICD-10-CM | POA: Diagnosis not present

## 2019-09-11 LAB — POCT URINALYSIS DIPSTICK OB
Bilirubin, UA: NEGATIVE
Blood, UA: NEGATIVE
Glucose, UA: NEGATIVE
Ketones, UA: NEGATIVE
Nitrite, UA: NEGATIVE
POC,PROTEIN,UA: NEGATIVE
Spec Grav, UA: 1.01 (ref 1.010–1.025)
Urobilinogen, UA: 0.2 E.U./dL
pH, UA: 6 (ref 5.0–8.0)

## 2019-09-11 NOTE — Progress Notes (Signed)
ROB-No complaints.  

## 2019-09-11 NOTE — Progress Notes (Signed)
ROB-Doing well, no questions or concerns. Here of BPP due to postdates pregnancy, see results below. Scheduled for IOL tomorrow, orders pending. Reviewed red flag symptoms and when to call. RTC x 7 weeks for PPV or sooner if needed.   ULTRASOUND REPORT  Location: [facility] Date of Service: 09/11/2019   Indications:growth/afi Findings:  Selena Rogers intrauterine pregnancy is visualized with FHR at 153 BPM. Biometrics give an (U/S) Gestational age of [redacted]w[redacted]d and an (U/S) EDD of 09/07/2019; this correlates with the clinically established Estimated Date of Delivery: 09/07/19.  Fetal presentation : cephalic. Placenta: anterior. Grade: 3  AFI: 16.3 cm  Growth percentile is 69. EFW: 3815 g ( 8 lbs 7 oz)   BPP Scoring: Movement: 2/2  Tone: 2/2  Breathing: 2/2  AFI: 2/2  Impression: 1. [redacted]w[redacted]d Viable Singleton Intrauterine pregnancy dated by previously established criteria. 2. AFI is 16.3 cm.  3. BPP is 8/8  Recommendations: 1.Clinical correlation with the patient's History and Physical Exam.

## 2019-09-11 NOTE — Patient Instructions (Signed)

## 2019-09-12 ENCOUNTER — Inpatient Hospital Stay: Payer: Medicaid Other | Admitting: Anesthesiology

## 2019-09-12 ENCOUNTER — Inpatient Hospital Stay
Admission: RE | Admit: 2019-09-12 | Discharge: 2019-09-14 | DRG: 768 | Disposition: A | Discharge status: Home / Self Care | Payer: Medicaid Other | Attending: Certified Nurse Midwife | Admitting: Certified Nurse Midwife

## 2019-09-12 DIAGNOSIS — Z3A40 40 weeks gestation of pregnancy: Secondary | ICD-10-CM

## 2019-09-12 DIAGNOSIS — J45909 Unspecified asthma, uncomplicated: Secondary | ICD-10-CM | POA: Diagnosis present

## 2019-09-12 DIAGNOSIS — O9081 Anemia of the puerperium: Secondary | ICD-10-CM | POA: Diagnosis not present

## 2019-09-12 DIAGNOSIS — A6000 Herpesviral infection of urogenital system, unspecified: Secondary | ICD-10-CM | POA: Diagnosis present

## 2019-09-12 DIAGNOSIS — O26893 Other specified pregnancy related conditions, third trimester: Secondary | ICD-10-CM | POA: Diagnosis present

## 2019-09-12 DIAGNOSIS — O9832 Other infections with a predominantly sexual mode of transmission complicating childbirth: Secondary | ICD-10-CM | POA: Diagnosis present

## 2019-09-12 DIAGNOSIS — O480 Post-term pregnancy: Principal | ICD-10-CM | POA: Diagnosis present

## 2019-09-12 DIAGNOSIS — Z8659 Personal history of other mental and behavioral disorders: Secondary | ICD-10-CM

## 2019-09-12 DIAGNOSIS — Z3490 Encounter for supervision of normal pregnancy, unspecified, unspecified trimester: Secondary | ICD-10-CM | POA: Diagnosis present

## 2019-09-12 LAB — CBC
HCT: 38.7 % (ref 36.0–46.0)
Hemoglobin: 12.2 g/dL (ref 12.0–15.0)
MCH: 25.9 pg — ABNORMAL LOW (ref 26.0–34.0)
MCHC: 31.5 g/dL (ref 30.0–36.0)
MCV: 82.2 fL (ref 80.0–100.0)
Platelets: 167 10*3/uL (ref 150–400)
RBC: 4.71 MIL/uL (ref 3.87–5.11)
RDW: 17.3 % — ABNORMAL HIGH (ref 11.5–15.5)
WBC: 8.4 10*3/uL (ref 4.0–10.5)
nRBC: 0 % (ref 0.0–0.2)

## 2019-09-12 LAB — TYPE AND SCREEN
ABO/RH(D): O POS
Antibody Screen: NEGATIVE

## 2019-09-12 MED ORDER — DIPHENHYDRAMINE HCL 50 MG/ML IJ SOLN
12.5000 mg | INTRAMUSCULAR | Status: DC | PRN
  Administered 2019-09-13: 12.5 mg via INTRAVENOUS
  Filled 2019-09-12: qty 1

## 2019-09-12 MED ORDER — EPHEDRINE 5 MG/ML INJ
10.0000 mg | INTRAVENOUS | Status: DC | PRN
  Filled 2019-09-12: qty 2

## 2019-09-12 MED ORDER — PHENYLEPHRINE 40 MCG/ML (10ML) SYRINGE FOR IV PUSH (FOR BLOOD PRESSURE SUPPORT)
80.0000 ug | PREFILLED_SYRINGE | INTRAVENOUS | Status: DC | PRN
  Filled 2019-09-12: qty 10

## 2019-09-12 MED ORDER — LACTATED RINGERS IV SOLN
500.0000 mL | INTRAVENOUS | Status: DC | PRN
  Administered 2019-09-12 – 2019-09-13 (×3): 500 mL via INTRAVENOUS

## 2019-09-12 MED ORDER — TERBUTALINE SULFATE 1 MG/ML IJ SOLN: 0.2500 mg | Freq: Once | INTRAMUSCULAR | Status: DC | PRN

## 2019-09-12 MED ORDER — LIDOCAINE-EPINEPHRINE (PF) 1.5 %-1:200000 IJ SOLN
INTRAMUSCULAR | Status: DC | PRN
  Administered 2019-09-12: 4 mL via PERINEURAL

## 2019-09-12 MED ORDER — FENTANYL 2.5 MCG/ML W/ROPIVACAINE 0.15% IN NS 100 ML EPIDURAL (ARMC)
EPIDURAL | Status: AC
  Filled 2019-09-12: qty 100

## 2019-09-12 MED ORDER — LIDOCAINE HCL (PF) 1 % IJ SOLN
INTRAMUSCULAR | Status: DC | PRN
  Administered 2019-09-12: 3 mL via INTRADERMAL

## 2019-09-12 MED ORDER — FENTANYL-BUPIVACAINE-NACL 0.5-0.125-0.9 MG/250ML-% EP SOLN: 12.0000 mL/h | EPIDURAL | Status: DC | PRN

## 2019-09-12 MED ORDER — MISOPROSTOL 200 MCG PO TABS
ORAL_TABLET | ORAL | Status: AC
  Filled 2019-09-12: qty 4

## 2019-09-12 MED ORDER — MISOPROSTOL 100 MCG PO TABS
50.0000 ug | ORAL_TABLET | ORAL | Status: DC
  Administered 2019-09-12: 50 ug via VAGINAL
  Filled 2019-09-12 (×2): qty 1

## 2019-09-12 MED ORDER — LIDOCAINE HCL (PF) 1 % IJ SOLN
INTRAMUSCULAR | Status: AC
  Filled 2019-09-12: qty 30

## 2019-09-12 MED ORDER — OXYTOCIN BOLUS FROM INFUSION
500.0000 mL | Freq: Once | INTRAVENOUS | Status: AC
  Administered 2019-09-13: 500 mL via INTRAVENOUS

## 2019-09-12 MED ORDER — AMMONIA AROMATIC IN INHA
RESPIRATORY_TRACT | Status: AC
  Filled 2019-09-12: qty 10

## 2019-09-12 MED ORDER — FENTANYL 2.5 MCG/ML W/ROPIVACAINE 0.15% IN NS 100 ML EPIDURAL (ARMC)
12.0000 mL/h | EPIDURAL | Status: DC | PRN
  Filled 2019-09-12: qty 100

## 2019-09-12 MED ORDER — BUPIVACAINE HCL (PF) 0.25 % IJ SOLN
INTRAMUSCULAR | Status: DC | PRN
  Administered 2019-09-12 (×2): 4 mL via EPIDURAL

## 2019-09-12 MED ORDER — LACTATED RINGERS IV SOLN: 500.0000 mL | Freq: Once | INTRAVENOUS | Status: AC

## 2019-09-12 MED ORDER — OXYTOCIN 10 UNIT/ML IJ SOLN
INTRAMUSCULAR | Status: AC
  Filled 2019-09-12: qty 2

## 2019-09-12 MED ORDER — ACETAMINOPHEN 325 MG PO TABS: 650.0000 mg | ORAL_TABLET | ORAL | Status: DC | PRN

## 2019-09-12 MED ORDER — OXYTOCIN 40 UNITS IN NORMAL SALINE INFUSION - SIMPLE MED
2.5000 [IU]/h | INTRAVENOUS | Status: DC
  Filled 2019-09-12: qty 1000

## 2019-09-12 MED ORDER — LACTATED RINGERS IV SOLN
INTRAVENOUS | Status: DC
  Administered 2019-09-12: 1000 mL via INTRAVENOUS
  Administered 2019-09-13: 03:00:00 via INTRAVENOUS

## 2019-09-12 MED ORDER — FENTANYL 2.5 MCG/ML W/ROPIVACAINE 0.15% IN NS 100 ML EPIDURAL (ARMC)
EPIDURAL | Status: DC | PRN
  Administered 2019-09-12: 12 mL/h via EPIDURAL
  Administered 2019-09-13: 250 ug via EPIDURAL

## 2019-09-12 MED ORDER — BUTORPHANOL TARTRATE 1 MG/ML IJ SOLN: 1.0000 mg | INTRAMUSCULAR | Status: DC | PRN

## 2019-09-12 MED ORDER — LIDOCAINE HCL (PF) 1 % IJ SOLN
30.0000 mL | INTRAMUSCULAR | Status: AC | PRN
  Administered 2019-09-13: 30 mL via SUBCUTANEOUS

## 2019-09-12 NOTE — H&P (Signed)
Obstetric History and Physical  Selena Rogers Jimmye Norman is a 23 y.o. G1P0000 with IUP at [redacted]w[redacted]d presenting for postdates induction of labor.   Patient states she has been having irregular contractions, none vaginal bleeding, intact membranes, with active fetal movement.    Denies difficulty breathing or respiratory distress, chest pain, abdominal pain, dysuria, and leg pain or swelling.   Prenatal Course  Source of Care: [facility]-initial visit at 12 wks, total visits: 12  Pregnancy complications or risks: History of depression, Asthma, History of genital herpes on prophylaxis  Prenatal labs and studies:  ABO, Rh: O/Positive/-- March 05, 2023 0000)  Antibody: Negative Mar 05, 2023 0000)  Rubella: History vaccine x two (2) doses  Varicella: 425 (10/20 1104)  RPR: Non Reactive (08/10 1125)   HBsAg: Negative 03/05/2023 0000)   HIV: Non reactive 03-05-2023 0000)  GBS:--/Negative (10/06 1344)   1 hr Glucola: 131 (08/10 1125)  Genetic screening: low risk female (04/20 1032)  Anatomy US: Complete, normal (06/18 9563)  Past Medical History:  Diagnosis Date  . Anxiety   . Asthma   . Depression   . Genital herpes   . Seasonal allergies     Past Surgical History:  Procedure Laterality Date  . TONSILLECTOMY      OB History  Gravida Para Term Preterm AB Living  1 0 0 0 0 0  SAB TAB Ectopic Multiple Live Births  0 0 0 0 0    # Outcome Date GA Lbr Len/2nd Weight Sex Delivery Anes PTL Lv  1 Current             Social History   Socioeconomic History  . Marital status: Married    Spouse name: Not on file  . Number of children: Not on file  . Years of education: Not on file  . Highest education level: Not on file  Occupational History  . Not on file  Social Needs  . Financial resource strain: Not on file  . Food insecurity    Worry: Not on file    Inability: Not on file  . Transportation needs    Medical: Not on file    Non-medical: Not on file  Tobacco Use  . Smoking status:  Never Smoker  . Smokeless tobacco: Never Used  Substance and Sexual Activity  . Alcohol use: No    Frequency: Never  . Drug use: No  . Sexual activity: Yes    Birth control/protection: None  Lifestyle  . Physical activity    Days per week: Not on file    Minutes per session: Not on file  . Stress: Not on file  Relationships  . Social Herbalist on phone: Not on file    Gets together: Not on file    Attends religious service: Not on file    Active member of club or organization: Not on file    Attends meetings of clubs or organizations: Not on file    Relationship status: Not on file  Other Topics Concern  . Not on file  Social History Narrative  . Not on file    Family History  Problem Relation Age of Onset  . Seizures Brother     Medications Prior to Admission  Medication Sig Dispense Refill Last Dose  . beclomethasone (QVAR) 40 MCG/ACT inhaler Inhale 2 puffs into the lungs 2 (two) times daily. 1 Inhaler 0 09/11/2019 at Unknown time  . fluticasone (FLONASE) 50 MCG/ACT nasal spray 1 spray by Each Nare route daily.  09/11/2019 at Unknown time  . montelukast (SINGULAIR) 10 MG tablet Take 10 mg by mouth at bedtime.   09/11/2019 at Unknown time  . Prenatal Vit-Fe Fumarate-FA (PRENATAL MULTIVITAMIN) TABS tablet Take 1 tablet by mouth daily at 12 noon.   09/11/2019 at Unknown time  . PROAIR HFA 108 (90 Base) MCG/ACT inhaler INHALE 2 PUFFS BY MOUTH EVERY 4 HOURS AS NEEDED FOR COUGH OR WHEEZING 8.5 g 0 09/11/2019 at Unknown time  . sertraline (ZOLOFT) 50 MG tablet Take by mouth.   09/11/2019 at Unknown time  . valACYclovir (VALTREX) 1000 MG tablet Take 1 tablet (1,000 mg total) by mouth daily. 30 tablet 2 09/11/2019 at Unknown time    Allergies  Allergen Reactions  . Clindamycin/Lincomycin   . Zithromax [Azithromycin]     Review of Systems: Negative except for what is mentioned in HPI.  Physical Exam:  LMP 12/01/2018 (Exact Date)   GENERAL: Well-developed,  well-nourished female in no acute distress.   LUNGS: Clear to auscultation bilaterally.   HEART: Regular rate and rhythm.  ABDOMEN: Soft, nontender, nondistended, gravid.  EXTREMITIES: Nontender, no edema, 2+ distal pulses.  Cervical Exam: Unchanged since previous exam  Fetal Wellbeing: Category I  Contractions: Irregular contractions, soft resting tone   Pertinent Labs/Studies:   Results for orders placed or performed in visit on 09/11/19 (from the past 24 hour(s))  POC Urinalysis Dipstick OB     Status: Abnormal   Collection Time: 09/11/19  1:41 PM  Result Value Ref Range   Color, UA Yellow    Clarity, UA Clear    Glucose, UA Negative Negative   Bilirubin, UA Negative    Ketones, UA Negative    Spec Grav, UA 1.010 1.010 - 1.025   Blood, UA Negative    pH, UA 6.0 5.0 - 8.0   POC,PROTEIN,UA Negative Negative, Trace, Small (1+), Moderate (2+), Large (3+), 4+   Urobilinogen, UA 0.2 0.2 or 1.0 E.U./dL   Nitrite, UA Negative    Leukocytes, UA Moderate (2+) (A) Negative   Appearance     Odor      Assessment :  Selena Rogers Selena Rogers is a 23 y.o. G1P0000 at [redacted]w[redacted]d being admitted for induction of labor due to postdates pregnancy, Rh positive, History of HSV on prophylaxis, GBS negative  FHR Category I  Plan:  Admit tor birthing suites, see orders  Induction/Augmentation as needed, per protocol  Delivery plan: Hopeful for vaginal delivery  Reviewed red flag symptoms and when to call  Dr. Logan Bores notified of admission and plan or care   Gunnar Bulla, CNM [facility], [facility] 09/12/19 8:20 AM

## 2019-09-12 NOTE — Progress Notes (Signed)
Patient ID: Selena Rogers, female   DOB: Apr 05, 1996, 23 y.o.   MRN: [id number]  Selena Rogers is a 23 y.o. G1P0000 at [redacted]w[redacted]d by LMP admitted for induction of labor due to Post dates. Due date 09/07/2019.  Subjective:  Patient sitting on birthing ball, reports increased frequency and duration of contractions.   Mother of patient at bedside for continuous labor support.   Denies difficulty breathing or respiratory distress, chest pain, excessive vaginal bleeding, dysuria, and leg pain or swelling.   Objective:  Temp:  [98.4 F (36.9 C)-98.6 F (37 C)] 98.4 F (36.9 C) (11/06 1805) Pulse Rate:  [73-110] 80 (11/06 1805) Resp:  [20] 20 (11/06 0814) BP: (121-144)/(80-89) 144/89 (11/06 1805) Weight:  [76.7 kg] 76.7 kg (11/06 0814)  Fetal Wellbeing:  Category I  UC:   irregular, every one (1) to five (5) minutes; soft resting tone  SVE:   Dilation: 4 Effacement (%): 80, 90 Station: -1 Exam by:: [facility] RN    Labs: Lab Results  Component Value Date   WBC 8.4 09/12/2019   HGB 12.2 09/12/2019   HCT 38.7 09/12/2019   MCV 82.2 09/12/2019   PLT 167 09/12/2019    Assessment:  Selena Rogers is a 23 y.o. G1P0000 at [redacted]w[redacted]d admitted for induction of labor due to postdates pregnancy, Rh positive, History of HSV on prophylaxis, GBS negative, AROM since 1326 09/12/2019  FHR Category I  Plan:  Encouraged position change and use of peanut ball.   Titrate pitocin per protocol as needed, see orders.   Reviewed red flag symptoms and when to call.   Continue orders as written. Reassess as needed.    Diona Fanti, CNM [facility], [facility] 09/12/2019, 7:08 PM

## 2019-09-12 NOTE — Progress Notes (Signed)
Patient ID: Selena Rogers, female   DOB: Jun 03, 1996, 23 y.o.   MRN: [id number]  Selena Rogers is a 23 y.o. G1P0000 at [redacted]w[redacted]d by LMP admitted for induction of labor due to Post dates. Due date 09/07/2019.  Subjective:  Patient sitting in bed, reports occasional contractions. Mother of patient at bedside for continuous labor support.   Denies difficulty breathing or respiratory distress, chest pain, excessive vaginal bleeding, dysuria, and leg pain or swelling.   Objective:  Temp:  [98.6 F (37 C)] 98.6 F (37 C) (11/06 0814) Pulse Rate:  [79-110] 110 (11/06 0814) Resp:  [20] 20 (11/06 0814) BP: (113-136)/(84-88) 136/88 (11/06 0814) Weight:  [76.7 kg-76.8 kg] 76.7 kg (11/06 0814)  Fetal Wellbeing:  Category I  UC:   regular, every one (1) to three (3) minutes; soft resting tone  SVE:   Dilation: 3 Effacement (%): 70 Station: -2 Exam by:: MLawhorn CNM  AROM: moderate amount fluid, light meconium  Labs: Lab Results  Component Value Date   WBC 8.4 09/12/2019   HGB 12.2 09/12/2019   HCT 38.7 09/12/2019   MCV 82.2 09/12/2019   PLT 167 09/12/2019    Assessment:  Selena Rogers is a 23 y.o. G1P0000 at [redacted]w[redacted]d admitted for induction of labor due to postdates pregnancy, Rh positive, History of HSV on prophylaxis, GBS negative  FHR Category I  Plan:  Discussed plan of care options, agrees to AROM.   Encouraged position change and use of peanut ball.   Reviewed red flag symptoms and when to call.   Continue orders as written. Reassess as needed.    Diona Fanti, CNM [facility], [facility] 09/12/2019, 1:29 PM

## 2019-09-12 NOTE — Anesthesia Procedure Notes (Signed)
Epidural Patient location during procedure: OB  Staffing Performed: anesthesiologist   Preanesthetic Checklist Completed: patient identified, site marked, surgical consent, pre-op evaluation, timeout performed, IV checked, risks and benefits discussed and monitors and equipment checked  Epidural Patient position: sitting Prep: Betadine Patient monitoring: heart rate, continuous pulse ox and blood pressure Approach: midline Location: L4-L5 Injection technique: LOR saline  Needle:  Needle type: Tuohy  Needle gauge: 17 G Needle length: 9 cm and 9 Needle insertion depth: 6 cm Catheter type: closed end flexible Catheter size: 19 Gauge Catheter at skin depth: 11 cm Test dose: negative and 1.5% lidocaine with Epi 1:200 K  Assessment Sensory level: T10 Events: blood not aspirated, injection not painful, no injection resistance, negative IV test and no paresthesia  Additional Notes   Patient tolerated the insertion well without complications.-SATD -IVTD. No paresthesia. Refer to [facility] nursing for VS and dosingReason for block:procedure for pain     

## 2019-09-12 NOTE — Anesthesia Preprocedure Evaluation (Signed)

## 2019-09-13 DIAGNOSIS — Z3490 Encounter for supervision of normal pregnancy, unspecified, unspecified trimester: Secondary | ICD-10-CM | POA: Diagnosis present

## 2019-09-13 DIAGNOSIS — O480 Post-term pregnancy: Principal | ICD-10-CM

## 2019-09-13 DIAGNOSIS — Z3A40 40 weeks gestation of pregnancy: Secondary | ICD-10-CM

## 2019-09-13 LAB — BASIC METABOLIC PANEL
Anion gap: 11 (ref 5–15)
BUN: 8 mg/dL (ref 6–20)
CO2: 18 mmol/L — ABNORMAL LOW (ref 22–32)
Calcium: 8.1 mg/dL — ABNORMAL LOW (ref 8.9–10.3)
Chloride: 108 mmol/L (ref 98–111)
Creatinine, Ser: 1.41 mg/dL — ABNORMAL HIGH (ref 0.44–1.00)
GFR calc Af Amer: >60 mL/min (ref >60)
GFR calc non Af Amer: 53 mL/min — ABNORMAL LOW (ref >60)
Glucose, Bld: 72 mg/dL (ref 70–99)
Potassium: 3.8 mmol/L (ref 3.5–5.1)
Sodium: 137 mmol/L (ref 135–145)

## 2019-09-13 LAB — RPR: RPR Ser Ql: NONREACTIVE

## 2019-09-13 MED ORDER — TERBUTALINE SULFATE 1 MG/ML IJ SOLN: 0.2500 mg | Freq: Once | INTRAMUSCULAR | Status: DC | PRN

## 2019-09-13 MED ORDER — OXYTOCIN 40 UNITS IN NORMAL SALINE INFUSION - SIMPLE MED
INTRAVENOUS | Status: AC
  Filled 2019-09-13: qty 1000

## 2019-09-13 MED ORDER — SODIUM CHLORIDE 0.9% FLUSH: 3.0000 mL | Freq: Two times a day (BID) | INTRAVENOUS | Status: DC

## 2019-09-13 MED ORDER — ACETAMINOPHEN 325 MG PO TABS: 650.0000 mg | ORAL_TABLET | ORAL | Status: DC | PRN

## 2019-09-13 MED ORDER — ONDANSETRON HCL 4 MG/2ML IJ SOLN: 4.0000 mg | INTRAMUSCULAR | Status: DC | PRN

## 2019-09-13 MED ORDER — DIPHENHYDRAMINE HCL 25 MG PO CAPS: 25.0000 mg | ORAL_CAPSULE | Freq: Four times a day (QID) | ORAL | Status: DC | PRN

## 2019-09-13 MED ORDER — METHYLERGONOVINE MALEATE 0.2 MG/ML IJ SOLN: 0.2000 mg | INTRAMUSCULAR | Status: DC | PRN

## 2019-09-13 MED ORDER — SODIUM CHLORIDE 0.9 % IV SOLN
2.0000 g | Freq: Four times a day (QID) | INTRAVENOUS | Status: DC
  Administered 2019-09-13 (×2): 2 g via INTRAVENOUS
  Filled 2019-09-13 (×5): qty 2000

## 2019-09-13 MED ORDER — ONDANSETRON HCL 4 MG PO TABS: 4.0000 mg | ORAL_TABLET | ORAL | Status: DC | PRN

## 2019-09-13 MED ORDER — OXYTOCIN 40 UNITS IN NORMAL SALINE INFUSION - SIMPLE MED
1.0000 m[IU]/min | INTRAVENOUS | Status: DC
  Administered 2019-09-13: 4 m[IU]/min via INTRAVENOUS
  Administered 2019-09-13: 2 m[IU]/min via INTRAVENOUS

## 2019-09-13 MED ORDER — SENNOSIDES-DOCUSATE SODIUM 8.6-50 MG PO TABS
2.0000 | ORAL_TABLET | ORAL | Status: DC
  Administered 2019-09-14: 2 via ORAL
  Filled 2019-09-13: qty 2

## 2019-09-13 MED ORDER — SIMETHICONE 80 MG PO CHEW: 80.0000 mg | CHEWABLE_TABLET | ORAL | Status: DC | PRN

## 2019-09-13 MED ORDER — GENTAMICIN SULFATE 40 MG/ML IJ SOLN
5.0000 mg/kg | INTRAVENOUS | Status: DC
  Administered 2019-09-13: 300 mg via INTRAVENOUS
  Filled 2019-09-13 (×2): qty 7.5

## 2019-09-13 MED ORDER — METRONIDAZOLE IN NACL 5-0.79 MG/ML-% IV SOLN
500.0000 mg | Freq: Once | INTRAVENOUS | Status: DC | PRN
  Filled 2019-09-13: qty 100

## 2019-09-13 MED ORDER — COCONUT OIL OIL
1.0000 "application " | TOPICAL_OIL | Status: DC | PRN
  Administered 2019-09-13: 1 via TOPICAL
  Filled 2019-09-13: qty 120

## 2019-09-13 MED ORDER — SODIUM CHLORIDE 0.9 % IV SOLN
2.0000 g | Freq: Four times a day (QID) | INTRAVENOUS | Status: DC
  Administered 2019-09-14 (×3): 2 g via INTRAVENOUS
  Filled 2019-09-13 (×4): qty 2000

## 2019-09-13 MED ORDER — SODIUM CHLORIDE 0.9% FLUSH: 3.0000 mL | INTRAVENOUS | Status: DC | PRN

## 2019-09-13 MED ORDER — WITCH HAZEL-GLYCERIN EX PADS
1.0000 "application " | MEDICATED_PAD | CUTANEOUS | Status: DC | PRN
  Filled 2019-09-13: qty 100

## 2019-09-13 MED ORDER — BENZOCAINE-MENTHOL 20-0.5 % EX AERO
INHALATION_SPRAY | CUTANEOUS | Status: AC
  Administered 2019-09-13: 18:00:00
  Filled 2019-09-13: qty 56

## 2019-09-13 MED ORDER — GENTAMICIN SULFATE 40 MG/ML IJ SOLN
5.0000 mg/kg | INTRAVENOUS | Status: DC
  Administered 2019-09-14: 300 mg via INTRAVENOUS
  Filled 2019-09-13: qty 7.5

## 2019-09-13 MED ORDER — METHYLERGONOVINE MALEATE 0.2 MG/ML IJ SOLN
INTRAMUSCULAR | Status: AC
  Filled 2019-09-13: qty 1

## 2019-09-13 MED ORDER — PRENATAL MULTIVITAMIN CH
1.0000 | ORAL_TABLET | Freq: Every day | ORAL | Status: DC
  Administered 2019-09-14: 1 via ORAL
  Filled 2019-09-13: qty 1

## 2019-09-13 MED ORDER — SODIUM CHLORIDE 0.9 % IV SOLN: 250.0000 mL | INTRAVENOUS | Status: DC | PRN

## 2019-09-13 MED ORDER — BENZOCAINE-MENTHOL 20-0.5 % EX AERO
1.0000 "application " | INHALATION_SPRAY | CUTANEOUS | Status: DC | PRN
  Administered 2019-09-13: 1 via TOPICAL
  Filled 2019-09-13: qty 56

## 2019-09-13 MED ORDER — METHYLERGONOVINE MALEATE 0.2 MG PO TABS: 0.2000 mg | ORAL_TABLET | ORAL | Status: DC | PRN

## 2019-09-13 MED ORDER — IBUPROFEN 600 MG PO TABS
600.0000 mg | ORAL_TABLET | Freq: Four times a day (QID) | ORAL | Status: DC
  Administered 2019-09-13 – 2019-09-14 (×4): 600 mg via ORAL
  Filled 2019-09-13 (×4): qty 1

## 2019-09-13 MED ORDER — SODIUM CHLORIDE 0.9 % IV SOLN
INTRAVENOUS | Status: DC | PRN
  Administered 2019-09-13: 500 mL via INTRAVENOUS

## 2019-09-13 MED ORDER — DIBUCAINE (PERIANAL) 1 % EX OINT
1.0000 "application " | TOPICAL_OINTMENT | CUTANEOUS | Status: DC | PRN
  Filled 2019-09-13: qty 28

## 2019-09-13 NOTE — Lactation Note (Signed)
This note was copied from a baby's chart. Lactation Consultation Note  Patient Name: Selena Rogers Date: 09/13/2019 Reason for consult: Initial assessment;Primapara;NICU baby;Term;Other (Comment)(Difficult delivery)  Finally able to get mom started pumping for [name] in [facility] 5 hours after delivery.  When first went in to pump mom she was not feeling like pumping, next time she was sleeping, 3rd time she was eating dinner, 4th time she was going to [facility] to see [name] and 5th time she was on [facility].  Instructed and assisted mom, incorporating GOB's help, in warmth, massage, hand expression, pumping, collection, storage, labeling, cleaning and handling of expressed milk.  Reviewed supply and demand and encouraged mom to pump 8 or more times in 24 hours or every 2 to 4 hours to bring in mature milk and ensure a plentiful milk supply.  Reviewed normal newborn stomach size, normal course of lactation and routine newborn feeding patterns.  Praised mom for her commitment to provide breast milk for [name] in [facility].  Mom did not get any colostrum at first pumping.  Explained this was normal for first pumpings.  Lactation name and number written on white board and encouraged to call with any questions, concerns or assistance.   Maternal Data Formula Feeding for Exclusion: No Has patient been taught Hand Expression?: Yes Does the patient have breastfeeding experience prior to this delivery?: No(Gr1)  Feeding    LATCH Score                   Interventions Interventions: Breast feeding basics reviewed;Expressed milk;Coconut oil;DEBP  Lactation Tools Discussed/Used Tools: Pump;Coconut oil Breast pump type: Double-Electric Breast Pump WIC Program: Yes Pump Review: Setup, frequency, and cleaning;Milk Storage;Other (comment) Initiated by:: S.Yemaya Barnier,RN,BSN,IBCLC Date initiated:: 09/13/19   Consult Status Consult Status: Follow-up Follow-up type: Call as  needed    Jarold Motto 09/13/2019, 9:26 PM

## 2019-09-13 NOTE — Progress Notes (Addendum)
Patient ID: Selena Rogers, female   DOB: May 27, 1996, 23 y.o.   MRN: [id number]  Selena Rogers is a 23 y.o. G1P0000 at [redacted]w[redacted]d by LMP admitted for induction of labor due to Post dates. Due date 09/07/2019.  Subjective:  Patient resting quietly in bed;l no pelvic pressure or urge to push at this time.   Mother of patient at bedside for continuous labor support.   Denies difficulty breathing or respiratory distress, chest pain, abdominal pain, dysuria, and leg pain or swelling.   Objective:  Temp:  [97.6 F (36.4 C)-101.8 F (38.8 C)] 99.7 F (37.6 C) (11/07 0610) Pulse Rate:  [59-215] 84 (11/07 0620) Resp:  [18-20] 18 (11/06 1918) BP: (83-152)/(65-94) 152/74 (11/07 0620) SpO2:  [98 %-100 %] 99 % (11/07 0620) Weight:  [76.7 kg] 76.7 kg (11/06 0814)  Fetal Wellbeing:  Category I and Category II  UC:   irregular, every two (2) to four(4) minutes; soft resting tone and non-tender on palapation, pitocin infusion stopped by nursing staff  SVE:   Dilation: 8 Effacement (%): 90 Station: Plus 1 Exam by:: Geovany Trudo  Labs: Lab Results  Component Value Date   WBC 8.4 09/12/2019   HGB 12.2 09/12/2019   HCT 38.7 09/12/2019   MCV 82.2 09/12/2019   PLT 167 09/12/2019    Assessment:  Selena D Selena WILLIAMSis a 23 y.o.G1P0000 at [redacted]w[redacted]d admitted for induction oflabor due to postdates pregnancy, Rh positive, 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   Definition and signs of chorioamnionitis reviewed with RN, patient and family. Patient wishes to start antibiotic treatment at this time, see orders.   Reviewed red flag symptoms and when to call.   Continue orders as written including restarting pitocin. Reassess as needed.   Assessment and plan of care update provided to Dr. Amalia Hailey.     Diona Fanti, CNM [facility], [facility] 09/13/2019, 7:08 AM

## 2019-09-13 NOTE — Progress Notes (Signed)
Pharmacy Antibiotic Note  Selena Rogers is a 23 y.o. female admitted on 09/12/2019 with chorioanmionitis.  Pharmacy has been consulted for gentamicin dosing.  Plan: Will start gentamicin 5 mg/kg IV daily and check BMP to assess renal function   Will start gentamicin 300 mg IV q24h, if renal function is poor will readjust dosing interval and continue to monitor. Will check gentamicin trough prior to dose on 11/10 Goal trough < 1 - 2 mcg/mL or undetectable for extended interval dosing.  Height: 5\' 1"  (154.9 cm) Weight: 169 lb (76.7 kg) IBW/kg (Calculated) : 47.8  Temp (24hrs), Avg:98.7 F (37.1 C), Min:97.6 F (36.4 C), Max:101.8 F (38.8 C)  Recent Labs  Lab 09/12/19 0829  WBC 8.4    CrCl cannot be calculated (Patient's most recent lab result is older than the maximum 21 days allowed.).    Allergies  Allergen Reactions  . Clindamycin/Lincomycin Rash  . Zithromax [Azithromycin] Rash    Thank you for allowing pharmacy to be a part of this patient's care.  Tobie Lords, PharmD, BCPS Clinical Pharmacist 09/13/2019 7:07 AM

## 2019-09-13 NOTE — Plan of Care (Signed)
Oriented to Room and Fall Prevention and [facility] as well as Fall Risk and Prevention;Pt. V/O.

## 2019-09-13 NOTE — Progress Notes (Signed)
Patient ID: Selena Rogers, female   DOB: 1996-02-09, 23 y.o.   MRN: [id number]  Selena Rogers is a 23 y.o. G1P0000 at [redacted]w[redacted]d by LMP admitted for induction of labor due to Post dates. Due date 09/07/2019.  Subjective:  Patient resting quietly in bed with eyes closed, reports pain relief since epidural placement; no pelvic pressure or urge to push at this time.   Mother of patient at bedside for continuous labor support.   Denies difficulty breathing or respiratory distress, chest pain, abdominal pain, dysuria, and leg pain or swelling.   Objective:  Temp:  [97.6 F (36.4 C)-98.6 F (37 C)] 98.2 F (36.8 C) (11/07 0245) Pulse Rate:  [59-215] 83 (11/07 0219) Resp:  [18-20] 18 (11/06 1918) BP: (83-144)/(65-94) 109/73 (11/07 0219) SpO2:  [98 %-100 %] 100 % (11/07 0215) Weight:  [76.7 kg] 76.7 kg (11/06 0814)  Fetal Wellbeing:  Category I and Category II  UC:   irregular, every two (2) to six (6) minutes; soft resting tone, pitocin infusing at 8 mu/min  SVE:   Dilation: 6.5 Effacement (%): 90 Station: Plus 1 Exam by:: Mcgwire Dasaro CNM  Labs: Lab Results  Component Value Date   WBC 8.4 09/12/2019   HGB 12.2 09/12/2019   HCT 38.7 09/12/2019   MCV 82.2 09/12/2019   PLT 167 09/12/2019    Assessment:  Selena D HUGHES WILLIAMSis a 23 y.o.G1P0000 at [redacted]w[redacted]d admitted for induction oflabor due to postdates pregnancy, Rh positive, 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   IUPC inserted without difficulty. Titrate pitocin per protocol until adequate MVUs obtained.   Advised to reposition every 30 minutes.   Reviewed red flag symptoms and when to call.   Continue orders as written. Reassess as needed.    Diona Fanti, CNM [facility], [facility] 09/13/2019, 3:33 AM

## 2019-09-14 ENCOUNTER — Ambulatory Visit: Payer: Self-pay

## 2019-09-14 ENCOUNTER — Encounter: Payer: Self-pay | Admitting: Student in an Organized Health Care Education/Training Program

## 2019-09-14 LAB — CBC
HCT: 25.7 % — ABNORMAL LOW (ref 36.0–46.0)
Hemoglobin: 8.8 g/dL — ABNORMAL LOW (ref 12.0–15.0)
MCH: 26.1 pg (ref 26.0–34.0)
MCHC: 34.2 g/dL (ref 30.0–36.0)
MCV: 76.3 fL — ABNORMAL LOW (ref 80.0–100.0)
Platelets: 149 10*3/uL — ABNORMAL LOW (ref 150–400)
RBC: 3.37 MIL/uL — ABNORMAL LOW (ref 3.87–5.11)
RDW: 17.4 % — ABNORMAL HIGH (ref 11.5–15.5)
WBC: 17.8 10*3/uL — ABNORMAL HIGH (ref 4.0–10.5)
nRBC: 0 % (ref 0.0–0.2)

## 2019-09-14 MED ORDER — ACETAMINOPHEN 325 MG PO TABS: 650.0000 mg | ORAL_TABLET | ORAL | 0 refills | Status: DC | PRN

## 2019-09-14 MED ORDER — IBUPROFEN 600 MG PO TABS: 600.0000 mg | ORAL_TABLET | Freq: Four times a day (QID) | ORAL | 0 refills | Status: DC

## 2019-09-14 MED ORDER — FERROUS SULFATE 325 (65 FE) MG PO TABS: 325.0000 mg | ORAL_TABLET | Freq: Two times a day (BID) | ORAL | Status: DC

## 2019-09-14 MED ORDER — COCONUT OIL OIL: 1.0000 "application " | TOPICAL_OIL | 0 refills | Status: DC | PRN

## 2019-09-14 MED ORDER — WITCH HAZEL-GLYCERIN EX PADS: 1.0000 "application " | MEDICATED_PAD | CUTANEOUS | 12 refills | Status: DC | PRN

## 2019-09-14 MED ORDER — ESCITALOPRAM OXALATE 10 MG PO TABS: 10.0000 mg | ORAL_TABLET | Freq: Every day | ORAL | 2 refills | Status: DC

## 2019-09-14 MED ORDER — SENNOSIDES-DOCUSATE SODIUM 8.6-50 MG PO TABS: 2.0000 | ORAL_TABLET | ORAL | 0 refills | Status: DC

## 2019-09-14 MED ORDER — FERROUS SULFATE 325 (65 FE) MG PO TABS: 325.0000 mg | ORAL_TABLET | Freq: Two times a day (BID) | ORAL | 3 refills | Status: DC

## 2019-09-14 MED ORDER — SIMETHICONE 80 MG PO CHEW: 80.0000 mg | CHEWABLE_TABLET | ORAL | 0 refills | Status: DC | PRN

## 2019-09-14 NOTE — Discharge Instructions (Signed)
Breast Pumping Tips There may be times when you cannot feed your baby from your breast, such as when you are at work or on a trip. Breast pumping allows you to remove milk from your breast in order to store for later use. There are three ways to pump. You can use:  Your hand to massage and squeeze your breast (hand expression).  A handheld manual pump.  An electric pump. When you first start to pump, you may not get much milk, but after a few days your breasts should start to make more. Pumping can help stimulate your milk supply after your baby is born. It can also help maintain your milk supply when you are away from your baby. When should I pump? You can start pumping soon after your baby is born. Here are some tips on when to pump:  When with your baby: ? Pump after breastfeeding. ? Pump from the free breast while you breastfeed.  When away from your baby: ? Pump every 2-3 hours for about 15 minutes. ? Pump both breasts at the same time if you can.  If your baby gets formula feeding, pump around the time your baby gets that feeding.  If you drank alcohol, wait 2 hours before pumping.  If you are having a procedure with anesthesia, talk to your health care provider about when you should pump before and after. How do I prepare to pump? Take steps to relax. This makes it easier to stimulate your let-down reflex, which is what makes breast milk flow. To help:  Smell one of your infant's blankets or an item of clothing.  Look at a picture or video of your infant.  Sit in a quiet, private space.  Massage your breast and nipple.  Place a warm cloth on your breast. The cloth should be a little wet.  Play relaxing music.  Picture your milk flowing. What are some tips? General tips for pumping breast milk   Always wash your hands before pumping.  If you are not getting very much milk or pumping is uncomfortable, make adjustments to your pump or try using different type of  pumps.  Drink enough fluid to keep your urine clear or pale yellow.  Wear clothing that opens in the front or allows easy access to your breasts.  Pump breast milk directly into clean bottles or other storage containers.  Do not use any products that contain nicotine or tobacco, such as cigarettes and e-cigarettes. These can lower your milk supply and harm your infant. If you need help quitting, ask your health care provider. Tips for storing breast milk   Store breast milk in a clean, BPA-free container, such as glass or plastic bottles or milk storage bags.  Store breast milk in 2-4 ounce batches to reduce waste.  Swirl the breast milk in the container to mix any cream that floats to the top. Do not shake it.  Label all stored milk with the date you pumped it.  The amount of time you can keep breast milk depends on where it is stored: ? Room temperature: 6-8 hours, if the milk is clean. It is best if used within 4 hours. ? Cooler with ice packs: 24 hours. ? Refrigerator: 5-8 days, if the milk is clean. It is best if used within 3 days. ? Freezer: 9-12 months, if the milk is clean and stored away from the freezer door. It is best if used within 6 months.  When using a refrigerator  or freezer, put the milk in the back to keep it as cold as possible.  Thaw frozen milk using warm water. Do not use the microwave. Tips for choosing a breast pump The right pump for you will depend on your comfort and how often you will be away from your baby. When choosing a pump, consider the following:  Manual breast pumps do not need electricity to work. They are usually cheaper than electric pumps, but they can be harder to use. They may be a good choice if you are occasionally away from your baby.  Electric breast pumps are usually more expensive than manual pumps, but they can be easier for some women to use. They can also collect more milk than manual pumps. This makes them a good choice for women  who work in an office or need to be away from their baby for longer periods of time.  The suction cup (flange) should be the right size. If it is the wrong size, it may cause pain and nipple damage.  Before buying a pump, find out whether your insurance covers the cost of a breast pump. Tips for maintaining a breast pump  Check your pump's manual for cleaning tips.  Clean the pump after each use. To do this: ? Wipe down the electrical unit. Use a dry, soft cloth or clean paper towel. Do not put the electrical unit in water or cleaning products. ? Wash the plastic pump parts with soap and warm water or in the dishwasher, if the parts are dishwasher safe. You do not need to clean the tubing unless it comes in contact with breast milk. Let the parts air dry. Avoid drying them with a cloth or towel. ? When the pump parts are clean and dry, put the pump back together. Then store the pump.  If there is water in the tubing when it comes time to pump, attach the tubing to the pump and turn on the pump. Run the pump until the tube is dry.  Avoid touching the inside of pump parts that come in contact with breast milk. Summary  Pumping can help stimulate your milk supply after your baby is born. It can also help maintain your milk supply when you are away from your baby.  When you are away from your infant for several hours, pump for about 15 minutes every 2-3 hours. Pump both breasts at the same time, if you can.  Your health care provider or lactation consultant can help you decide which breast pump is right for you. The right pump for you depends on your comfort, work schedule, and how often you may be away from your baby. This information is not intended to replace advice given to you by your health care provider. Make sure you discuss any questions you have with your health care provider. Document Released: 04/12/2010 Document Revised: 02/12/2019 Document Reviewed: 11/27/2016 Elsevier Patient  Education  2020 [employer]. Pregnancy and Anemia  Anemia is a condition in which the concentration of red blood cells, or hemoglobin, in the blood is below normal. Hemoglobin is a substance in red blood cells that carries oxygen to the tissues of the body. Anemia results when enough oxygen does not reach these tissues. Anemia is common during pregnancy because the woman's body needs more blood volume and blood cells to provide nutrition to the fetus. The fetus needs iron and folic acid as it is developing. Your body may not produce enough red blood cells because of  this. Also, during pregnancy, the liquid part of the blood (plasma) increases by about 30-50%, and the red blood cells increase by only 20%. This lowers the concentration of the red blood cells and creates a natural anemia-like situation. What are the causes? The most common cause of anemia during pregnancy is not having enough iron in the body to make red blood cells (iron deficiency anemia). Other causes may include:  Folic acid deficiency.  Vitamin B12 deficiency.  Certain prescription or over-the-counter medicines.  Certain medical conditions or infections that destroy red blood cells.  A low platelet count and bleeding caused by antibodies that go through the placenta to the fetus from the mothers blood. What are the signs or symptoms? Mild anemia may not be noticeable. If it becomes severe, symptoms may include:  Feeling tired (fatigue).  Shortness of breath, especially during activity.  Weakness.  Fainting.  Pale looking skin.  Headaches.  A fast or irregular heartbeat (palpitations).  Dizziness. How is this diagnosed? This condition may be diagnosed based on:  Your medical history and a physical exam.  Blood tests. How is this treated? Treatment for anemia during pregnancy depends on the cause of the anemia. Treatment can include:  Dietary changes.  Supplements of iron, vitamin B12, or folic  acid.  A blood transfusion. This may be needed if anemia is severe.  Hospitalization. This may be needed if there is a lot of blood loss or severe anemia. Follow these instructions at home:  Follow recommendations from your dietitian or health care provider about changing your diet.  Increase your vitamin C intake. This will help the stomach absorb more iron. Some foods that are high in vitamin C include: ? Oranges. ? Peppers. ? Tomatoes. ? Mangoes.  Eat a diet rich in iron. This would include foods such as: ? Liver. ? Beef. ? Eggs. ? Whole grains. ? Spinach. ? Dried fruit.  Take iron and vitamins as told by your health care provider.  Eat green leafy vegetables. These are a good source of folic acid.  Keep all follow-up visits as told by your health care provider. This is important. Contact a health care provider if:  You have frequent or lasting headaches.  You look pale.  You bruise easily. Get help right away if:  You have extreme weakness, shortness of breath, or chest pain.  You become dizzy or have trouble concentrating.  You have heavy vaginal bleeding.  You develop a rash.  You have bloody or black, tarry stools.  You faint.  You vomit up blood.  You vomit repeatedly.  You have abdominal pain.  You have a fever.  You are dehydrated. Summary  Anemia is a condition in which the concentration of red blood cells or hemoglobin in the blood is below normal.  Anemia is common during pregnancy because the woman's body needs more blood volume and blood cells to provide nutrition to the fetus.  The most common cause of anemia during pregnancy is not having enough iron in the body to make red blood cells (iron deficiency anemia).  Mild anemia may not be noticeable. If it becomes severe, symptoms may include feeling tired and weak. This information is not intended to replace advice given to you by your health care provider. Make sure you discuss any  questions you have with your health care provider. Document Released: 10/20/2000 Document Revised: 02/14/2019 Document Reviewed: 11/28/2016 Elsevier Patient Education  2020 [employer] Escitalopram tablets What is this medicine? ESCITALOPRAM (es sye  TAL oh pram) is used to treat depression and certain types of anxiety. This medicine may be used for other purposes; ask your health care provider or pharmacist if you have questions. COMMON BRAND NAME(S): Lexapro What should I tell my health care provider before I take this medicine? They need to know if you have any of these conditions:  bipolar disorder or a family history of bipolar disorder  diabetes  glaucoma  heart disease  kidney or liver disease  receiving electroconvulsive therapy  seizures (convulsions)  suicidal thoughts, plans, or attempt by you or a family member  an unusual or allergic reaction to escitalopram, the related drug citalopram, other medicines, foods, dyes, or preservatives  pregnant or trying to become pregnant  breast-feeding How should I use this medicine? Take this medicine by mouth with a glass of water. Follow the directions on the prescription label. You can take it with or without food. If it upsets your stomach, take it with food. Take your medicine at regular intervals. Do not take it more often than directed. Do not stop taking this medicine suddenly except upon the advice of your doctor. Stopping this medicine too quickly may cause serious side effects or your condition may worsen. A special MedGuide will be given to you by the pharmacist with each prescription and refill. Be sure to read this information carefully each time. Talk to your pediatrician regarding the use of this medicine in children. Special care may be needed. Overdosage: If you think you have taken too much of this medicine contact a poison control center or emergency room at once. NOTE: This medicine is only for you. Do not  share this medicine with others. What if I miss a dose? If you miss a dose, take it as soon as you can. If it is almost time for your next dose, take only that dose. Do not take double or extra doses. What may interact with this medicine? Do not take this medicine with any of the following medications:  certain medicines for fungal infections like fluconazole, itraconazole, ketoconazole, posaconazole, voriconazole  cisapride  citalopram  dronedarone  linezolid  MAOIs like Carbex, Eldepryl, Marplan, Nardil, and Parnate  methylene blue (injected into a vein)  pimozide  thioridazine This medicine may also interact with the following medications:  alcohol  amphetamines  aspirin and aspirin-like medicines  carbamazepine  certain medicines for depression, anxiety, or psychotic disturbances  certain medicines for migraine headache like almotriptan, eletriptan, frovatriptan, naratriptan, rizatriptan, sumatriptan, zolmitriptan  certain medicines for sleep  certain medicines that treat or prevent blood clots like warfarin, enoxaparin, dalteparin  cimetidine  diuretics  dofetilide  fentanyl  furazolidone  isoniazid  lithium  metoprolol  NSAIDs, medicines for pain and inflammation, like ibuprofen or naproxen  other medicines that prolong the QT interval (cause an abnormal heart rhythm)  procarbazine  rasagiline  supplements like St. John's wort, kava kava, valerian  tramadol  tryptophan  ziprasidone This list may not describe all possible interactions. Give your health care provider a list of all the medicines, herbs, non-prescription drugs, or dietary supplements you use. Also tell them if you smoke, drink alcohol, or use illegal drugs. Some items may interact with your medicine. What should I watch for while using this medicine? Tell your doctor if your symptoms do not get better or if they get worse. Visit your doctor or health care professional for  regular checks on your progress. Because it may take several weeks to see the full effects  of this medicine, it is important to continue your treatment as prescribed by your doctor. Patients and their families should watch out for new or worsening thoughts of suicide or depression. Also watch out for sudden changes in feelings such as feeling anxious, agitated, panicky, irritable, hostile, aggressive, impulsive, severely restless, overly excited and hyperactive, or not being able to sleep. If this happens, especially at the beginning of treatment or after a change in dose, call your health care professional. Bonita Quin may get drowsy or dizzy. Do not drive, use machinery, or do anything that needs mental alertness until you know how this medicine affects you. Do not stand or sit up quickly, especially if you are an older patient. This reduces the risk of dizzy or fainting spells. Alcohol may interfere with the effect of this medicine. Avoid alcoholic drinks. Your mouth may get dry. Chewing sugarless gum or sucking hard candy, and drinking plenty of water may help. Contact your doctor if the problem does not go away or is severe. What side effects may I notice from receiving this medicine? Side effects that you should report to your doctor or health care professional as soon as possible:  allergic reactions like skin rash, itching or hives, swelling of the face, lips, or tongue  anxious  black, tarry stools  changes in vision  confusion  elevated mood, decreased need for sleep, racing thoughts, impulsive behavior  eye pain  fast, irregular heartbeat  feeling faint or lightheaded, falls  feeling agitated, angry, or irritable  hallucination, loss of contact with reality  loss of balance or coordination  loss of memory  painful or prolonged erections  restlessness, pacing, inability to keep still  seizures  stiff muscles  suicidal thoughts or other mood changes  trouble  sleeping  unusual bleeding or bruising  unusually weak or tired  vomiting Side effects that usually do not require medical attention (report to your doctor or health care professional if they continue or are bothersome):  changes in appetite  change in sex drive or performance  headache  increased sweating  indigestion, nausea  tremors This list may not describe all possible side effects. Call your doctor for medical advice about side effects. You may report side effects to FDA at 1-800-FDA-1088. Where should I keep my medicine? Keep out of reach of children. Store at room temperature between 15 and 30 degrees C (59 and 86 degrees F). Throw away any unused medicine after the expiration date. NOTE: This sheet is a summary. It may not cover all possible information. If you have questions about this medicine, talk to your doctor, pharmacist, or health care provider.  2020 Elsevier/Gold Standard (2018-10-14 11:21:44) Care of a Perineal Tear A perineal tear is a cut or tear (laceration) in the tissue between the opening of the vagina and the anus (perineum). Some women develop a perineal tear during a vaginal birth. This can happen as the baby emerges from the birth canal and the perineum is stretched. There are four degrees of perineal tears based on how deep and long the laceration is:  First degree. This involves a shallow tear at the edge of the vaginal opening that extends slightly into the perineal skin.  Second degree. This involves tearing described in first degree perineal tear, and an additional deeper tear of the vaginal opening and perineal tissues. It may also include tearing of a muscle just under the perineal skin.  Third degree. This involves tearing described in first and second degree perineal tears,  with the addition that tearing in the third degree extends into the muscle of the anus (anal sphincter).  Fourth degree. This involves all levels of tears described in  first, second, and third degree perineal tears, with the tear in the fourth degree extending into the rectum. First and second degree perineal tears may or may not be stitched closed, depending on their location and appearance. Third and fourth degree perineal tears are stitched closed immediately after the babys birth. What are the risks? Depending on the type of perineal tear you have, you may be at risk for:  Bleeding.  Developing a collection of blood in the perineal tear area (hematoma).  Pain. This may include pain when you urinate, or pain when you have a bowel movement.  Infection at the site of the tear.  Fever.  Trouble controlling your urination or bowels (incontinence).  Painful sex. How to care for a perineal tear Wound care  Take a sitz bath as told by your health care provider. A sitz bath is a warm water bath that is taken while you are sitting down. The water should only come up to your hips and should cover your buttocks. This can speed up healing. ? Partially fill a bathtub with warm water. You will only need the water to be deep enough to cover your hips and buttocks when you are sitting in it. ? If your health care provider told you to put medicine in the water, follow the directions exactly as told. ? Sit in the water and open the tub drain a little. ? Turn on the warm water again to keep the tub at the correct level. Keep the water running constantly. ? Soak in the water for 15-20 minutes or as told by your health care provider. ? After the sitz bath, pat the affected area dry first. Do not rub it. ? Be careful when you stand up after the sitz bath because you may feel dizzy.  Wash your hands before and after applying medicine to the area.  Wear a sanitary pad as told by your health care provider. Change the pad as often as told by your health care provider.  Leave stitches (sutures), skin glue, or adhesive strips in place. These skin closures may need to  stay in place for 2 weeks or longer. If adhesive strip edges start to loosen and curl up, you may trim the loose edges. Do not remove adhesive strips completely unless your health care provider tells you to do that.  Check your wound every day for signs of infection. Check for: ? Redness, swelling, or pain. ? Fluid or blood. ? Warmth. ? Pus or a bad smell. Managing pain  If directed, put ice on the painful area: ? Put ice in a plastic bag. ? Place a towel between your skin and the bag. ? Leave the ice on for 20 minutes, 2-3 times a day.  Apply a numbing spray to the perineal tear site as told by your health care provider. This may help with discomfort.  Take and apply over-the-counter and prescription medicines only as told by your health care provider.  If told, put about 3 witch hazel-containing hemorrhoid treatment pads on top of your sanitary pad. The witch hazel in the hemorrhoid pads helps with swelling and discomfort.  Sit on an inflatable ring or pillow. This may provide comfort. General instructions  Squeeze warm water on your perineum after urinating. This should be done from front to back with a  squeeze bottle. Pat the area to dry it.  Do not have sex, use tampons, or place anything in your vagina for at least 6 weeks or as told by your health care provider.  Keep all follow-up visits as told by your health care provider. These include any postpartum visits. This is important. Contact a health care provider if:  Your pain is not relieved with medicines.  You have painful urination.  You have redness, swelling, or pain around your tear.  You have fluid or blood coming from your tear.  Your tear feels warm to the touch.  You have pus or a bad smell coming from your tear.  You have a fever. Get help right away if:  Your tear opens.  You cannot urinate.  You have an increase in bleeding.  You have severe pain. Summary  A perineal tear is a cut or tear  (laceration) in the tissue between the opening of the vagina and the anus (perineum).  There are four degrees of perineal tears based on how deep and long the laceration is.  First and second-degree perineal tears may or may not be stitched closed, depending on their location and appearance. Third and fourth- degree perineal tears are stitched closed immediately after the babys birth.  Follow your health care provider's instructions for caring for your perineal tear. Know how to manage pain and how to care for your wound. Know when to call your health care provider and when to seek immediate emergency care. This information is not intended to replace advice given to you by your health care provider. Make sure you discuss any questions you have with your health care provider. Document Released: 03/09/2014 Document Revised: 10/05/2017 Document Reviewed: 11/27/2016 Elsevier Patient Education  2020 [employer] Postpartum Care After Vaginal Delivery This sheet gives you information about how to care for yourself from the time you deliver your baby to up to 6-12 weeks after delivery (postpartum period). Your health care provider may also give you more specific instructions. If you have problems or questions, contact your health care provider. Follow these instructions at home: Vaginal bleeding  It is normal to have vaginal bleeding (lochia) after delivery. Wear a sanitary pad for vaginal bleeding and discharge. ? During the first week after delivery, the amount and appearance of lochia is often similar to a menstrual period. ? Over the next few weeks, it will gradually decrease to a dry, yellow-brown discharge. ? For most women, lochia stops completely by 4-6 weeks after delivery. Vaginal bleeding can vary from woman to woman.  Change your sanitary pads frequently. Watch for any changes in your flow, such as: ? A sudden increase in volume. ? A change in color. ? Large blood clots.  If you pass  a blood clot from your vagina, save it and call your health care provider to discuss. Do not flush blood clots down the toilet before talking with your health care provider.  Do not use tampons or douches until your health care provider says this is safe.  If you are not breastfeeding, your period should return 6-8 weeks after delivery. If you are feeding your child breast milk only (exclusive breastfeeding), your period may not return until you stop breastfeeding. Perineal care  Keep the area between the vagina and the anus (perineum) clean and dry as told by your health care provider. Use medicated pads and pain-relieving sprays and creams as directed.  If you had a cut in the perineum (episiotomy) or a  tear in the vagina, check the area for signs of infection until you are healed. Check for: ? More redness, swelling, or pain. ? Fluid or blood coming from the cut or tear. ? Warmth. ? Pus or a bad smell.  You may be given a squirt bottle to use instead of wiping to clean the perineum area after you go to the bathroom. As you start healing, you may use the squirt bottle before wiping yourself. Make sure to wipe gently.  To relieve pain caused by an episiotomy, a tear in the vagina, or swollen veins in the anus (hemorrhoids), try taking a warm sitz bath 2-3 times a day. A sitz bath is a warm water bath that is taken while you are sitting down. The water should only come up to your hips and should cover your buttocks. Breast care  Within the first few days after delivery, your breasts may feel heavy, full, and uncomfortable (breast engorgement). Milk may also leak from your breasts. Your health care provider can suggest ways to help relieve the discomfort. Breast engorgement should go away within a few days.  If you are breastfeeding: ? Wear a bra that supports your breasts and fits you well. ? Keep your nipples clean and dry. Apply creams and ointments as told by your health care  provider. ? You may need to use breast pads to absorb milk that leaks from your breasts. ? You may have uterine contractions every time you breastfeed for up to several weeks after delivery. Uterine contractions help your uterus return to its normal size. ? If you have any problems with breastfeeding, work with your health care provider or Advertising copywriterlactation consultant.  If you are not breastfeeding: ? Avoid touching your breasts a lot. Doing this can make your breasts produce more milk. ? Wear a good-fitting bra and use cold packs to help with swelling. ? Do not squeeze out (express) milk. This causes you to make more milk. Intimacy and sexuality  Ask your health care provider when you can engage in sexual activity. This may depend on: ? Your risk of infection. ? How fast you are healing. ? Your comfort and desire to engage in sexual activity.  You are able to get pregnant after delivery, even if you have not had your period. If desired, talk with your health care provider about methods of birth control (contraception). Medicines  Take over-the-counter and prescription medicines only as told by your health care provider.  If you were prescribed an antibiotic medicine, take it as told by your health care provider. Do not stop taking the antibiotic even if you start to feel better. Activity  Gradually return to your normal activities as told by your health care provider. Ask your health care provider what activities are safe for you.  Rest as much as possible. Try to rest or take a nap while your baby is sleeping. Eating and drinking   Drink enough fluid to keep your urine pale yellow.  Eat high-fiber foods every day. These may help prevent or relieve constipation. High-fiber foods include: ? Whole grain cereals and breads. ? Brown rice. ? Beans. ? Fresh fruits and vegetables.  Do not try to lose weight quickly by cutting back on calories.  Take your prenatal vitamins until your  postpartum checkup or until your health care provider tells you it is okay to stop. Lifestyle  Do not use any products that contain nicotine or tobacco, such as cigarettes and e-cigarettes. If you need help  quitting, ask your health care provider.  Do not drink alcohol, especially if you are breastfeeding. General instructions  Keep all follow-up visits for you and your baby as told by your health care provider. Most women visit their health care provider for a postpartum checkup within the first 3-6 weeks after delivery. Contact a health care provider if:  You feel unable to cope with the changes that your child brings to your life, and these feelings do not go away.  You feel unusually sad or worried.  Your breasts become red, painful, or hard.  You have a fever.  You have trouble holding urine or keeping urine from leaking.  You have little or no interest in activities you used to enjoy.  You have not breastfed at all and you have not had a menstrual period for 12 weeks after delivery.  You have stopped breastfeeding and you have not had a menstrual period for 12 weeks after you stopped breastfeeding.  You have questions about caring for yourself or your baby.  You pass a blood clot from your vagina. Get help right away if:  You have chest pain.  You have difficulty breathing.  You have sudden, severe leg pain.  You have severe pain or cramping in your lower abdomen.  You bleed from your vagina so much that you fill more than one sanitary pad in one hour. Bleeding should not be heavier than your heaviest period.  You develop a severe headache.  You faint.  You have blurred vision or spots in your vision.  You have bad-smelling vaginal discharge.  You have thoughts about hurting yourself or your baby. If you ever feel like you may hurt yourself or others, or have thoughts about taking your own life, get help right away. You can go to the nearest emergency  department or call:  Your local emergency services (911 in the [place]  A suicide crisis helpline, such as the National Suicide Prevention Lifeline at 734-437-9926. This is open 24 hours a day. Summary  The period of time right after you deliver your newborn up to 6-12 weeks after delivery is called the postpartum period.  Gradually return to your normal activities as told by your health care provider.  Keep all follow-up visits for you and your baby as told by your health care provider. This information is not intended to replace advice given to you by your health care provider. Make sure you discuss any questions you have with your health care provider. Document Released: 08/20/2007 Document Revised: 10/26/2017 Document Reviewed: 08/06/2017 Elsevier Patient Education  2020 [employer]. Postpartum Baby Blues The postpartum period begins right after the birth of a baby. During this time, there is often a lot of joy and excitement. It is also a time of many changes in the life of the parents. No matter how many times a mother gives birth, each child brings new challenges to the family, including different ways of relating to one another. It is common to have feelings of excitement along with confusing changes in moods, emotions, and thoughts. You may feel happy one minute and sad or stressed the next. These feelings of sadness usually happen in the period right after you have your baby, and they go away within a week or two. This is called the "baby blues." What are the causes? There is no known cause of baby blues. It is likely caused by a combination of factors. However, changes in hormone levels after childbirth are believed to  trigger some of the symptoms. Other factors that can play a role in these mood changes include:  Lack of sleep.  Stressful life events, such as poverty, caring for a loved one, or death of a loved one.  Genetics. What are the signs or symptoms? Symptoms of  this condition include:  Brief changes in mood, such as going from extreme happiness to sadness.  Decreased concentration.  Difficulty sleeping.  Crying spells and tearfulness.  Loss of appetite.  Irritability.  Anxiety. If the symptoms of baby blues last for more than 2 weeks or become more severe, you may have postpartum depression. How is this diagnosed? This condition is diagnosed based on an evaluation of your symptoms. There are no medical or lab tests that lead to a diagnosis, but there are various questionnaires that a health care provider may use to identify women with the baby blues or postpartum depression. How is this treated? Treatment is not needed for this condition. The baby blues usually go away on their own in 1-2 weeks. Social support is often all that is needed. You will be encouraged to get adequate sleep and rest. Follow these instructions at home: Lifestyle      Get as much rest as you can. Take a nap when the baby sleeps.  Exercise regularly as told by your health care provider. Some women find yoga and walking to be helpful.  Eat a balanced and nourishing diet. This includes plenty of fruits and vegetables, whole grains, and lean proteins.  Do little things that you enjoy. Have a cup of tea, take a bubble bath, read your favorite magazine, or listen to your favorite music.  Avoid alcohol.  Ask for help with household chores, cooking, grocery shopping, or running errands. Do not try to do everything yourself. Consider hiring a postpartum doula to help. This is a professional who specializes in providing support to new mothers.  Try not to make any major life changes during pregnancy or right after giving birth. This can add stress. General instructions  Talk to people close to you about how you are feeling. Get support from your partner, family members, friends, or other new moms. You may want to join a support group.  Find ways to cope with stress.  This may include: ? Writing your thoughts and feelings in a journal. ? Spending time outside. ? Spending time with people who make you laugh.  Try to stay positive in how you think. Think about the things you are grateful for.  Take over-the-counter and prescription medicines only as told by your health care provider.  Let your health care provider know if you have any concerns.  Keep all postpartum visits as told by your health care provider. This is important. Contact a health care provider if:  Your baby blues do not go away after 2 weeks. Get help right away if:  You have thoughts of taking your own life (suicidal thoughts).  You think you may harm the baby or other people.  You see or hear things that are not there (hallucinations). Summary  After giving birth, you may feel happy one minute and sad or stressed the next. Feelings of sadness that happen right after the baby is born and go away after a week or two are called the "baby blues."  You can manage the baby blues by getting enough rest, eating a healthy diet, exercising, spending time with supportive people, and finding ways to cope with stress.  If feelings of  sadness and stress last longer than 2 weeks or get in the way of caring for your baby, talk to your health care provider. This may mean you have postpartum depression. This information is not intended to replace advice given to you by your health care provider. Make sure you discuss any questions you have with your health care provider. Document Released: 07/27/2004 Document Revised: 02/14/2019 Document Reviewed: 12/19/2016 Elsevier Patient Education  2020 [employer].

## 2019-09-14 NOTE — Clinical Social Work Maternal (Signed)
  CLINICAL SOCIAL WORK MATERNAL/CHILD NOTE  Patient Details  Name: Selena Rogers MRN: [id number] Date of Birth: 10-Feb-1996  Date:  09/14/2019  Clinical Social Worker Initiating Note:  23 year old African American female w/positive [place] postpartum w/hx of depression and anxiety.  Newborn being transferred to [facility]/patient being discharged today. Date/Time: Initiated:  09/14/19/1030     Child's Name:  [name] [facility]   Biological Parents:  Mother, Father(Father: Caroline More)   Need for Interpreter:      Reason for Referral:  [facility](postpartum)   Address:  [place] [address]    Phone number:  [phone] (home)     Additional phone number: [phone]  Household Members/Support Persons (HM/SP):   Household Member/Support Person 1   HM/SP Name Relationship DOB or Age  HM/SP -24 Hassel Neth Mother    HM/SP -2        HM/SP -3        HM/SP -4        HM/SP -5        HM/SP -6        HM/SP -7        HM/SP -8          Natural Supports (not living in the home):  Parent, Radiographer, therapeutic Supports: Therapist([employer])   Employment: Animator   Type of Work: Chief Strategy Officer Aid   Education:  Attending Administrator, sports)   Homebound arranged:    Museum/gallery curator Resources:  Medicaid(Family medicaid - needs to apply for [employer])   Other Resources:  [facility]   Cultural/Religious Considerations Which May Impact Care:  None identified.   Strengths:  Ability to meet basic needs    Psychotropic Medications:  Patient reported that she was taking Lexapro prior to pregnancy. Stop taking Lexapro during her pregnancy.  Stated that she plans on restarting meds.   Pediatrician:    Dr. Clide Deutscher, [facility], [place], [place].   Pediatrician List:   [facility]       Pediatrician Fax Number:    Risk Factors/Current Problems:  Mental Health Concerns (hx of depression and anxiety)   Cognitive State:  Goal Oriented , Alert , Able to Concentrate    Mood/Affect:  Calm    CSW Assessment: The patient is a 23 year old [place] American female who presented with a history of behavioral heath issues, depression, anxiety, and relationship issues.  The patient reported that her depression is persistent and plans on restarting her medication upon discharge. Denied SI/HI. The patient lives with her mother who is a good support. Psychoeducational on postpartum. Provided pt with resources that are available in the [place] area. Provided patient with emergency number and [employer] informations.    CSW Plan/Description:  No Further Intervention Required/No Barriers to Discharge    [place], LCSW 09/14/2019, 11:26 AM

## 2019-09-14 NOTE — Lactation Note (Signed)
This note was copied from a baby's chart. Lactation Consultation Note  Patient Name: Selena Rogers ATFTD'D Date: 09/14/2019 Reason for consult: Follow-up assessment;Mother's request;Primapara;NICU baby;Term;Other (Comment)(Transferred to [facility] hospital with seizures)  [name] is being transferred to [facility] NICU with seizures.  Mom is being discharged to go with baby.  Rented mom Symphony through [facility] for 1 week or until can get DEBP through [facility].  Referral faxed to [facility] to get pump.  Message left on answering machine at [facility].  Encouraged mom to ask to speak to lactation consultant when get to [facility].  Lactation community resources given and discussed and contact numbers given encouraging mom to call with any questions, concerns or assistance.   Maternal Data Formula Feeding for Exclusion: No Has patient been taught Hand Expression?: Yes Does the patient have breastfeeding experience prior to this delivery?: No(Gr66 23 year old)  Feeding    LATCH Score                   Interventions    Lactation Tools Discussed/Used Tools: Pump;Coconut oil Breast pump type: Double-Electric Breast Pump(Rented Symphony through [employer]) [place] Program: Yes Pump Review: Setup, frequency, and cleaning;Milk Storage;Other (comment) Initiated by:: S.Ranie Chinchilla,RN,BSN, IBCLC Date initiated:: 09/13/19   Consult Status Consult Status: PRN Follow-up type: Call as needed    Jarold Motto 09/14/2019, 2:47 PM

## 2019-09-14 NOTE — Progress Notes (Signed)
Pt discharged. Discharge instructions, prescriptions, and follow up appointments given to and reviewed with patient. Pt verbalized understanding. Escorted out by staff. 

## 2019-09-14 NOTE — Discharge Summary (Signed)
Obstetric Discharge Summary  Patient ID: Selena Rogers MRN: [id number] DOB/AGE: 24-Jan-1996 23 y.o.   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  Postpartum course was complicated by EDPS 16, pt requested to restart Lexapro 10 mg PO daily at this time.  By time of discharge on PPD#1, her pain was controlled on oral pain medications; she had appropriate lochia and was ambulating, voiding without difficulty and tolerating regular diet.  She was deemed stable for discharge to home, due to transfer of infant to higher level of care, with close follow up in the office.   Labs: CBC Latest Ref Rng & Units 09/14/2019 09/12/2019 06/16/2019  WBC 4.0 - 10.5 K/uL 17.8(H) 8.4 11.6(H)  Hemoglobin 12.0 - 15.0 g/dL 8.8(L) 12.2 12.0  Hematocrit 36.0 - 46.0 % 25.7(L) 38.7 34.6  Platelets 150 - 400 K/uL 149(L) 167 223   O POS  Physical exam:   Temp:  [97.5 F (36.4 C)-99.2 F (37.3 C)] 97.8 F (36.6 C) (11/08 0743) Pulse Rate:  [75-129] 97 (11/08 0743) Resp:  [16-20] 20 (11/08 0743) BP: (106-144)/(65-96) 119/80 (11/08 0743) SpO2:  [98 %-100 %] 99 % (11/08 0743)  General: alert and no distress  Lochia: appropriate  Abdomen: soft, NT  Uterine Fundus: firm  Perineum:  healing well, no significant drainage, no dehiscence, no significant erythema  Extremities: no evidence of DVT seen on physical exam.   Edinburgh Postnatal Depression Scale Screening Tool 09/14/2019  I have been able to laugh and see the funny side of things. 1  I have looked forward with enjoyment to things. 1  I have blamed myself unnecessarily when things went wrong. 2  I have been anxious or worried for no good reason. 2  I have felt scared or panicky for no good reason. 2  Things have been getting on top of me. 2  I have been so unhappy that I have had difficulty sleeping. 2  I have felt sad or miserable. 2  I have been so unhappy that I have been crying. 2  The thought of harming myself has occurred to me. 0  Edinburgh Postnatal Depression Scale Total 16    Discharge Instructions: Per After Visit Summary.  Activity: Advance as tolerated. Pelvic rest for 6 weeks.  Also refer to After Visit Summary  Diet: Regular  Medications: Allergies as of 09/14/2019      Reactions   Clindamycin/lincomycin Rash   Zithromax [azithromycin] Rash      Medication List    STOP taking these medications   sertraline 50 MG tablet Commonly known as: ZOLOFT   valACYclovir 1000 MG tablet Commonly known as: Valtrex     TAKE these medications   acetaminophen 325 MG tablet Commonly known as: Tylenol Take 2 tablets (650 mg total) by mouth every 4 (four) hours as needed (for pain scale < 4).  beclomethasone 40 MCG/ACT inhaler Commonly known as: QVAR Inhale 2 puffs into the lungs 2 (two) times daily.   coconut oil Oil Apply 1 application topically as needed.   escitalopram 10 MG tablet Commonly known as: Lexapro Take 1 tablet (10 mg total) by mouth daily.   ferrous sulfate 325 (65 FE) MG tablet Take 1 tablet (325 mg total) by mouth 2 (two) times daily with a meal.   fluticasone 50 MCG/ACT nasal spray Commonly known as: FLONASE 1 spray by Each Nare route daily.   ibuprofen 600 MG  tablet Commonly known as: ADVIL Take 1 tablet (600 mg total) by mouth every 6 (six) hours.   montelukast 10 MG tablet Commonly known as: SINGULAIR Take 10 mg by mouth at bedtime.   prenatal multivitamin Tabs tablet Take 1 tablet by mouth daily at 12 noon.   ProAir HFA 108 (90 Base) MCG/ACT inhaler Generic drug: albuterol INHALE 2 PUFFS BY MOUTH EVERY 4 HOURS AS NEEDED FOR COUGH OR WHEEZING   senna-docusate 8.6-50 MG tablet Commonly known as: Senokot-S Take 2 tablets by mouth daily.   simethicone 80 MG chewable tablet Commonly known as: MYLICON Chew 1 tablet (80 mg total) by mouth as needed for flatulence.   witch hazel-glycerin pad Commonly known as: TUCKS Apply 1 application topically as needed for hemorrhoids.            Discharge Care Instructions  (From admission, onward)         Start     Ordered   09/14/19 0000  Discharge wound care:    Comments: See AVS   09/14/19 1017         Outpatient follow up:  Follow-up Information    Gunnar Bulla, CNM. Go on 09/18/2019.   Specialties: Certified Nurse Midwife, Obstetrics and Gynecology, Radiology Why: Someone from the office will call you with the time of your visit with [facility] on Thursday Contact information: [address] [place] [address] [phone]        Linzie Collin, MD. Schedule an appointment as soon as possible for a visit in 6 week(s).   Specialties: Obstetrics and Gynecology, Radiology Why: Someone from the office will contact you with the date of your six (6) week postpartum visit. You will see Dr. Logan Bores since Marcelino Duster will be out on maternity leave.  Contact information: [address] [place] [address] [phone]          Postpartum contraception: abstinence; will discuss further at postpartum visit  Discharged Condition: stable  Discharged to: home   Newborn Data:  Disposition:Transferred to [facility] for higher level  of care  Apgars: APGAR (1 MIN):  1 APGAR (5 MINS):  4 APGAR (10 MINS):  8   Gunnar Bulla, CNM [facility], [facility] 09/14/19 10:25 AM

## 2019-09-16 ENCOUNTER — Telehealth: Payer: Self-pay | Admitting: Certified Nurse Midwife

## 2019-09-16 NOTE — Telephone Encounter (Signed)
Pt's mother is wanting to confirm what appointments the pt needs next s far as scheduling. Please confirm and message me back so I am able to confirm with pt apts are correct.

## 2019-09-16 NOTE — Anesthesia Postprocedure Evaluation (Signed)
Anesthesia Post Note  Patient: Selena Rogers  Procedure(s) Performed: AN AD [place]  Patient location during evaluation: Mother Baby Anesthesia Type: Epidural Level of consciousness: awake and alert and oriented Pain management: pain level controlled Vital Signs Assessment: post-procedure vital signs reviewed and stable Respiratory status: spontaneous breathing Cardiovascular status: blood pressure returned to baseline Anesthetic complications: no Comments: Patient discharged prior to visit.  No apparent anesthesia issues according to staff.     Last Vitals: There were no vitals filed for this visit.  Last Pain: There were no vitals filed for this visit.               Taleya Whitcher

## 2019-09-17 LAB — SURGICAL PATHOLOGY

## 2019-09-18 ENCOUNTER — Other Ambulatory Visit: Payer: Self-pay

## 2019-09-18 ENCOUNTER — Ambulatory Visit (INDEPENDENT_AMBULATORY_CARE_PROVIDER_SITE_OTHER): Payer: Medicaid Other | Admitting: Certified Nurse Midwife

## 2019-09-18 ENCOUNTER — Encounter: Payer: Self-pay | Admitting: Certified Nurse Midwife

## 2019-09-18 VITALS — BP 109/79 | HR 80 | Ht 61.0 in | Wt 153.5 lb

## 2019-09-18 DIAGNOSIS — Z8659 Personal history of other mental and behavioral disorders: Secondary | ICD-10-CM

## 2019-09-18 NOTE — Progress Notes (Signed)
GYN ENCOUNTER NOTE  Subjective:       Selena Rogers Selena Rogers is a 23 y.o. G22P1001 female here for postpartum mood and 3b obstetrical laceration repair check.   Status post vacuum assisted vaginal birth on 09/13/2019, see delivery summary for further details.   Taking Lexapro 10 mg PO nightly, start immediately after hospital discharge on 09/14/2019. Feels numb and "like she is trying to act normal when things aren't normal".   Pumping at home; milk is in. Daughter receiving donor milk feeding [facility].   No SI/HI. Denies difficulty breathing or respiratory distress, chest pain, abdominal pain, excessive vaginal bleeding, dysuria, and leg pain or swelling.    Gynecologic History  No LMP recorded.   Contraception: abstinence  Last Pap: 02/2019. Results were: Normal   Obstetric History  OB History  Gravida Para Term Preterm AB Living  1 1 1  0 0 1  SAB TAB Ectopic Multiple Live Births  0 0 0 0 1    # Outcome Date GA Lbr Len/2nd Weight Sex Delivery Anes PTL Lv  1 Term 09/13/19 [redacted]w[redacted]d  8 lb 5 oz (3.771 kg) F Vag-Vacuum EPI N LIV     Complications: Failure to Progress in Second Stage    Past Medical History:  Diagnosis Date  . Anxiety   . Asthma   . Depression   . Genital herpes   . Seasonal allergies     Past Surgical History:  Procedure Laterality Date  . TONSILLECTOMY      Current Outpatient Medications on File Prior to Visit  Medication Sig Dispense Refill  . acetaminophen (TYLENOL) 325 MG tablet Take 2 tablets (650 mg total) by mouth every 4 (four) hours as needed (for pain scale < 4). 48 tablet 0  . beclomethasone (QVAR) 40 MCG/ACT inhaler Inhale 2 puffs into the lungs 2 (two) times daily. 1 Inhaler 0  . coconut oil OIL Apply 1 application topically as needed. 120 mL 0  . escitalopram (LEXAPRO) 10 MG tablet Take 1 tablet (10 mg total) by mouth daily. 30 tablet 2  . ferrous sulfate 325 (65 FE) MG tablet Take 1 tablet (325 mg total) by mouth 2 (two) times  daily with a meal. 60 tablet 3  . fluticasone (FLONASE) 50 MCG/ACT nasal spray 1 spray by Each Nare route daily.    [redacted]w[redacted]d ibuprofen (ADVIL) 600 MG tablet Take 1 tablet (600 mg total) by mouth every 6 (six) hours. 30 tablet 0  . montelukast (SINGULAIR) 10 MG tablet Take 10 mg by mouth at bedtime.    . Prenatal Vit-Fe Fumarate-FA (PRENATAL MULTIVITAMIN) TABS tablet Take 1 tablet by mouth daily at 12 noon.    Marland Kitchen PROAIR HFA 108 (90 Base) MCG/ACT inhaler INHALE 2 PUFFS BY MOUTH EVERY 4 HOURS AS NEEDED FOR COUGH OR WHEEZING 8.5 g 0  . senna-docusate (SENOKOT-S) 8.6-50 MG tablet Take 2 tablets by mouth daily. 30 tablet 0  . simethicone (MYLICON) 80 MG chewable tablet Chew 1 tablet (80 mg total) by mouth as needed for flatulence. 30 tablet 0  . witch hazel-glycerin (TUCKS) pad Apply 1 application topically as needed for hemorrhoids. 40 each 12   No current facility-administered medications on file prior to visit.     Allergies  Allergen Reactions  . Clindamycin/Lincomycin Rash  . Zithromax [Azithromycin] Rash    Social History   Socioeconomic History  . Marital status: Married    Spouse name: Not on file  . Number of children: Not on file  .  Years of education: Not on file  . Highest education level: Not on file  Occupational History  . Not on file  Social Needs  . Financial resource strain: Not on file  . Food insecurity    Worry: Not on file    Inability: Not on file  . Transportation needs    Medical: Not on file    Non-medical: Not on file  Tobacco Use  . Smoking status: Never Smoker  . Smokeless tobacco: Never Used  Substance and Sexual Activity  . Alcohol use: No    Frequency: Never  . Drug use: No  . Sexual activity: Not Currently    Birth control/protection: None  Lifestyle  . Physical activity    Days per week: Not on file    Minutes per session: Not on file  . Stress: Not on file  Relationships  . Social Herbalist on phone: Not on file    Gets together:  Not on file    Attends religious service: Not on file    Active member of club or organization: Not on file    Attends meetings of clubs or organizations: Not on file    Relationship status: Not on file  . Intimate partner violence    Fear of current or ex partner: Not on file    Emotionally abused: Not on file    Physically abused: Not on file    Forced sexual activity: Not on file  Other Topics Concern  . Not on file  Social History Narrative  . Not on file    Family History  Problem Relation Age of Onset  . Seizures Brother   . Breast cancer Neg Hx   . Ovarian cancer Neg Hx   . Colon cancer Neg Hx     The following portions of the patient's history were reviewed and updated as appropriate: allergies, current medications, past family history, past medical history, past social history, past surgical history and problem list.  Review of Systems  ROS negative except as noted above. Information obtained from patient.   Objective:   BP 109/79   Pulse 80   Ht 5\' 1"  (1.549 m)   Wt 153 lb 8 oz (69.6 kg)   Breastfeeding Yes   BMI 29.00 kg/m    CONSTITUTIONAL: Well-developed, well-nourished female in no acute distress.   PELVIC:  External Genitalia: Healing, laceration well approximated  Recutm; Normal, no hemorrhoids present  MUSCULOSKELETAL: Normal range of motion. No tenderness.  No cyanosis, clubbing, or edema.  Edinburgh Postnatal Depression Scale Screening Tool 09/18/2019 09/14/2019 09/14/2019  I have been able to laugh and see the funny side of things. 1 1 1   I have looked forward with enjoyment to things. 1 1 1   I have blamed myself unnecessarily when things went wrong. 2 2 2   I have been anxious or worried for no good reason. 2 2 2   I have felt scared or panicky for no good reason. 1 2 2   Things have been getting on top of me. 2 2 2   I have been so unhappy that I have had difficulty sleeping. 2 2 2   I have felt sad or miserable. 1 2 2   I have been so unhappy  that I have been crying. 1 2 2   The thought of harming myself has occurred to me. 0 0 0  Edinburgh Postnatal Depression Scale Total 13 16 16     Depression screen [facility] 2/9 09/18/2019 06/16/2019 02/24/2019  Decreased  Interest 0 0 2  Down, Depressed, Hopeless 0 0 0  PHQ - 2 Score 0 0 2  Altered sleeping 0 1 0  Tired, decreased energy 0 1 3  Change in appetite 0 1 0  Feeling bad or failure about yourself  0 0 0  Trouble concentrating 0 0 0  Moving slowly or fidgety/restless 0 0 0  Suicidal thoughts 0 0 0  PHQ-9 Score 0 3 5   GAD 7 : Generalized Anxiety Score 09/18/2019 06/16/2019  Nervous, Anxious, on Edge 0 0  Control/stop worrying 2 1  Worry too much - different things 0 1  Trouble relaxing 2 1  Restless 0 0  Easily annoyed or irritable 2 1  Afraid - awful might happen 0 0  Total GAD 7 Score 6 4  Anxiety Difficulty Not difficult at all Not difficult at all    Assessment:   1. History of depression  2. Obstetric vaginal laceration with type 3b third degree perineal laceration   Plan:   Continue medication as prescribed.   Patient agrees to contact previously counselor, Robby SermonPatty Sprouse, to restart services.   [facility].   Reviewed red flag symptoms and when to call.   RTC as previously scheduled or sooner if needed.    Gunnar BullaJenkins Michelle Bellany Elbaum, CNM [facility] 09/18/19 5:38 PM

## 2019-09-18 NOTE — Patient Instructions (Signed)
Postpartum Baby Blues The postpartum period begins right after the birth of a baby. During this time, there is often a lot of joy and excitement. It is also a time of many changes in the life of the parents. No matter how many times a mother gives birth, each child brings new challenges to the family, including different ways of relating to one another. It is common to have feelings of excitement along with confusing changes in moods, emotions, and thoughts. You may feel happy one minute and sad or stressed the next. These feelings of sadness usually happen in the period right after you have your baby, and they go away within a week or two. This is called the "baby blues." What are the causes? There is no known cause of baby blues. It is likely caused by a combination of factors. However, changes in hormone levels after childbirth are believed to trigger some of the symptoms. Other factors that can play a role in these mood changes include:  Lack of sleep.  Stressful life events, such as poverty, caring for a loved one, or death of a loved one.  Genetics. What are the signs or symptoms? Symptoms of this condition include:  Brief changes in mood, such as going from extreme happiness to sadness.  Decreased concentration.  Difficulty sleeping.  Crying spells and tearfulness.  Loss of appetite.  Irritability.  Anxiety. If the symptoms of baby blues last for more than 2 weeks or become more severe, you may have postpartum depression. How is this diagnosed? This condition is diagnosed based on an evaluation of your symptoms. There are no medical or lab tests that lead to a diagnosis, but there are various questionnaires that a health care provider may use to identify women with the baby blues or postpartum depression. How is this treated? Treatment is not needed for this condition. The baby blues usually go away on their own in 1-2 weeks. Social support is often all that is needed. You will  be encouraged to get adequate sleep and rest. Follow these instructions at home: Lifestyle      Get as much rest as you can. Take a nap when the baby sleeps.  Exercise regularly as told by your health care provider. Some women find yoga and walking to be helpful.  Eat a balanced and nourishing diet. This includes plenty of fruits and vegetables, whole grains, and lean proteins.  Do little things that you enjoy. Have a cup of tea, take a bubble bath, read your favorite magazine, or listen to your favorite music.  Avoid alcohol.  Ask for help with household chores, cooking, grocery shopping, or running errands. Do not try to do everything yourself. Consider hiring a postpartum doula to help. This is a professional who specializes in providing support to new mothers.  Try not to make any major life changes during pregnancy or right after giving birth. This can add stress. General instructions  Talk to people close to you about how you are feeling. Get support from your partner, family members, friends, or other new moms. You may want to join a support group.  Find ways to cope with stress. This may include: ? Writing your thoughts and feelings in a journal. ? Spending time outside. ? Spending time with people who make you laugh.  Try to stay positive in how you think. Think about the things you are grateful for.  Take over-the-counter and prescription medicines only as told by your health care provider.    Let your health care provider know if you have any concerns.  Keep all postpartum visits as told by your health care provider. This is important. Contact a health care provider if:  Your baby blues do not go away after 2 weeks. Get help right away if:  You have thoughts of taking your own life (suicidal thoughts).  You think you may harm the baby or other people.  You see or hear things that are not there (hallucinations). Summary  After giving birth, you may feel happy  one minute and sad or stressed the next. Feelings of sadness that happen right after the baby is born and go away after a week or two are called the "baby blues."  You can manage the baby blues by getting enough rest, eating a healthy diet, exercising, spending time with supportive people, and finding ways to cope with stress.  If feelings of sadness and stress last longer than 2 weeks or get in the way of caring for your baby, talk to your health care provider. This may mean you have postpartum depression. This information is not intended to replace advice given to you by your health care provider. Make sure you discuss any questions you have with your health care provider. Document Released: 07/27/2004 Document Revised: 02/14/2019 Document Reviewed: 12/19/2016 Elsevier Patient Education  2020 [employer] Escitalopram tablets What is this medicine? ESCITALOPRAM (es sye TAL oh pram) is used to treat depression and certain types of anxiety. This medicine may be used for other purposes; ask your health care provider or pharmacist if you have questions. COMMON BRAND NAME(S): Lexapro What should I tell my health care provider before I take this medicine? They need to know if you have any of these conditions:  bipolar disorder or a family history of bipolar disorder  diabetes  glaucoma  heart disease  kidney or liver disease  receiving electroconvulsive therapy  seizures (convulsions)  suicidal thoughts, plans, or attempt by you or a family member  an unusual or allergic reaction to escitalopram, the related drug citalopram, other medicines, foods, dyes, or preservatives  pregnant or trying to become pregnant  breast-feeding How should I use this medicine? Take this medicine by mouth with a glass of water. Follow the directions on the prescription label. You can take it with or without food. If it upsets your stomach, take it with food. Take your medicine at regular intervals. Do  not take it more often than directed. Do not stop taking this medicine suddenly except upon the advice of your doctor. Stopping this medicine too quickly may cause serious side effects or your condition may worsen. A special MedGuide will be given to you by the pharmacist with each prescription and refill. Be sure to read this information carefully each time. Talk to your pediatrician regarding the use of this medicine in children. Special care may be needed. Overdosage: If you think you have taken too much of this medicine contact a poison control center or emergency room at once. NOTE: This medicine is only for you. Do not share this medicine with others. What if I miss a dose? If you miss a dose, take it as soon as you can. If it is almost time for your next dose, take only that dose. Do not take double or extra doses. What may interact with this medicine? Do not take this medicine with any of the following medications:  certain medicines for fungal infections like fluconazole, itraconazole, ketoconazole, posaconazole, voriconazole  cisapride  citalopram  dronedarone  linezolid  MAOIs like Carbex, Eldepryl, Marplan, Nardil, and Parnate  methylene blue (injected into a vein)  pimozide  thioridazine This medicine may also interact with the following medications:  alcohol  amphetamines  aspirin and aspirin-like medicines  carbamazepine  certain medicines for depression, anxiety, or psychotic disturbances  certain medicines for migraine headache like almotriptan, eletriptan, frovatriptan, naratriptan, rizatriptan, sumatriptan, zolmitriptan  certain medicines for sleep  certain medicines that treat or prevent blood clots like warfarin, enoxaparin, dalteparin  cimetidine  diuretics  dofetilide  fentanyl  furazolidone  isoniazid  lithium  metoprolol  NSAIDs, medicines for pain and inflammation, like ibuprofen or naproxen  other medicines that prolong the QT  interval (cause an abnormal heart rhythm)  procarbazine  rasagiline  supplements like St. John's wort, kava kava, valerian  tramadol  tryptophan  ziprasidone This list may not describe all possible interactions. Give your health care provider a list of all the medicines, herbs, non-prescription drugs, or dietary supplements you use. Also tell them if you smoke, drink alcohol, or use illegal drugs. Some items may interact with your medicine. What should I watch for while using this medicine? Tell your doctor if your symptoms do not get better or if they get worse. Visit your doctor or health care professional for regular checks on your progress. Because it may take several weeks to see the full effects of this medicine, it is important to continue your treatment as prescribed by your doctor. Patients and their families should watch out for new or worsening thoughts of suicide or depression. Also watch out for sudden changes in feelings such as feeling anxious, agitated, panicky, irritable, hostile, aggressive, impulsive, severely restless, overly excited and hyperactive, or not being able to sleep. If this happens, especially at the beginning of treatment or after a change in dose, call your health care professional. Bonita QuinYou may get drowsy or dizzy. Do not drive, use machinery, or do anything that needs mental alertness until you know how this medicine affects you. Do not stand or sit up quickly, especially if you are an older patient. This reduces the risk of dizzy or fainting spells. Alcohol may interfere with the effect of this medicine. Avoid alcoholic drinks. Your mouth may get dry. Chewing sugarless gum or sucking hard candy, and drinking plenty of water may help. Contact your doctor if the problem does not go away or is severe. What side effects may I notice from receiving this medicine? Side effects that you should report to your doctor or health care professional as soon as  possible:  allergic reactions like skin rash, itching or hives, swelling of the face, lips, or tongue  anxious  black, tarry stools  changes in vision  confusion  elevated mood, decreased need for sleep, racing thoughts, impulsive behavior  eye pain  fast, irregular heartbeat  feeling faint or lightheaded, falls  feeling agitated, angry, or irritable  hallucination, loss of contact with reality  loss of balance or coordination  loss of memory  painful or prolonged erections  restlessness, pacing, inability to keep still  seizures  stiff muscles  suicidal thoughts or other mood changes  trouble sleeping  unusual bleeding or bruising  unusually weak or tired  vomiting Side effects that usually do not require medical attention (report to your doctor or health care professional if they continue or are bothersome):  changes in appetite  change in sex drive or performance  headache  increased sweating  indigestion, nausea  tremors  This list may not describe all possible side effects. Call your doctor for medical advice about side effects. You may report side effects to FDA at 1-800-FDA-1088. Where should I keep my medicine? Keep out of reach of children. Store at room temperature between 15 and 30 degrees C (59 and 86 degrees F). Throw away any unused medicine after the expiration date. NOTE: This sheet is a summary. It may not cover all possible information. If you have questions about this medicine, talk to your doctor, pharmacist, or health care provider.  2020 Elsevier/Gold Standard (2018-10-14 11:21:44) Care of a Perineal Tear A perineal tear is a cut or tear (laceration) in the tissue between the opening of the vagina and the anus (perineum). Some women develop a perineal tear during a vaginal birth. This can happen as the baby emerges from the birth canal and the perineum is stretched. There are four degrees of perineal tears based on how deep and  long the laceration is:  First degree. This involves a shallow tear at the edge of the vaginal opening that extends slightly into the perineal skin.  Second degree. This involves tearing described in first degree perineal tear, and an additional deeper tear of the vaginal opening and perineal tissues. It may also include tearing of a muscle just under the perineal skin.  Third degree. This involves tearing described in first and second degree perineal tears, with the addition that tearing in the third degree extends into the muscle of the anus (anal sphincter).  Fourth degree. This involves all levels of tears described in first, second, and third degree perineal tears, with the tear in the fourth degree extending into the rectum. First and second degree perineal tears may or may not be stitched closed, depending on their location and appearance. Third and fourth degree perineal tears are stitched closed immediately after the babys birth. What are the risks? Depending on the type of perineal tear you have, you may be at risk for:  Bleeding.  Developing a collection of blood in the perineal tear area (hematoma).  Pain. This may include pain when you urinate, or pain when you have a bowel movement.  Infection at the site of the tear.  Fever.  Trouble controlling your urination or bowels (incontinence).  Painful sex. How to care for a perineal tear Wound care  Take a sitz bath as told by your health care provider. A sitz bath is a warm water bath that is taken while you are sitting down. The water should only come up to your hips and should cover your buttocks. This can speed up healing. ? Partially fill a bathtub with warm water. You will only need the water to be deep enough to cover your hips and buttocks when you are sitting in it. ? If your health care provider told you to put medicine in the water, follow the directions exactly as told. ? Sit in the water and open the tub drain a  little. ? Turn on the warm water again to keep the tub at the correct level. Keep the water running constantly. ? Soak in the water for 15-20 minutes or as told by your health care provider. ? After the sitz bath, pat the affected area dry first. Do not rub it. ? Be careful when you stand up after the sitz bath because you may feel dizzy.  Wash your hands before and after applying medicine to the area.  Wear a sanitary pad as told by your health  care provider. Change the pad as often as told by your health care provider.  Leave stitches (sutures), skin glue, or adhesive strips in place. These skin closures may need to stay in place for 2 weeks or longer. If adhesive strip edges start to loosen and curl up, you may trim the loose edges. Do not remove adhesive strips completely unless your health care provider tells you to do that.  Check your wound every day for signs of infection. Check for: ? Redness, swelling, or pain. ? Fluid or blood. ? Warmth. ? Pus or a bad smell. Managing pain  If directed, put ice on the painful area: ? Put ice in a plastic bag. ? Place a towel between your skin and the bag. ? Leave the ice on for 20 minutes, 2-3 times a day.  Apply a numbing spray to the perineal tear site as told by your health care provider. This may help with discomfort.  Take and apply over-the-counter and prescription medicines only as told by your health care provider.  If told, put about 3 witch hazel-containing hemorrhoid treatment pads on top of your sanitary pad. The witch hazel in the hemorrhoid pads helps with swelling and discomfort.  Sit on an inflatable ring or pillow. This may provide comfort. General instructions  Squeeze warm water on your perineum after urinating. This should be done from front to back with a squeeze bottle. Pat the area to dry it.  Do not have sex, use tampons, or place anything in your vagina for at least 6 weeks or as told by your health care  provider.  Keep all follow-up visits as told by your health care provider. These include any postpartum visits. This is important. Contact a health care provider if:  Your pain is not relieved with medicines.  You have painful urination.  You have redness, swelling, or pain around your tear.  You have fluid or blood coming from your tear.  Your tear feels warm to the touch.  You have pus or a bad smell coming from your tear.  You have a fever. Get help right away if:  Your tear opens.  You cannot urinate.  You have an increase in bleeding.  You have severe pain. Summary  A perineal tear is a cut or tear (laceration) in the tissue between the opening of the vagina and the anus (perineum).  There are four degrees of perineal tears based on how deep and long the laceration is.  First and second-degree perineal tears may or may not be stitched closed, depending on their location and appearance. Third and fourth- degree perineal tears are stitched closed immediately after the babys birth.  Follow your health care provider's instructions for caring for your perineal tear. Know how to manage pain and how to care for your wound. Know when to call your health care provider and when to seek immediate emergency care. This information is not intended to replace advice given to you by your health care provider. Make sure you discuss any questions you have with your health care provider. Document Released: 03/09/2014 Document Revised: 10/05/2017 Document Reviewed: 11/27/2016 Elsevier Patient Education  2020 [employer].

## 2019-09-18 NOTE — Progress Notes (Signed)
Patient here for vaginal laceration check and mood check.  No complaints. Taking Lexapro.  Baby doing well, still at [facility].

## 2019-09-22 NOTE — Telephone Encounter (Signed)
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 Baby is in NICU at [facility], her husband left her recently as well.

## 2019-09-25 ENCOUNTER — Telehealth: Payer: Self-pay | Admitting: Licensed Clinical Social Worker

## 2019-09-25 NOTE — Telephone Encounter (Signed)
LCSW called to notify patient that the consent and information about services is at the clerical desk, and needs to be completed prior to [place] appointment.

## 2019-09-30 ENCOUNTER — Telehealth: Payer: Self-pay | Admitting: Licensed Clinical Social Worker

## 2019-09-30 ENCOUNTER — Ambulatory Visit: Payer: Self-pay | Admitting: Licensed Clinical Social Worker

## 2019-09-30 NOTE — Telephone Encounter (Signed)
LCSW attempted to call patient re: patient has not come by [facility] to sign consent needed prior to appointment. LCSW left vm for patient encouraging her to return call.

## 2019-09-30 NOTE — Telephone Encounter (Signed)
LCSW spoke with patient regarding consent needing to be signed - Patient reported that she came to [facility] to sign consent but was told that they didn't know what she needed. LCSW and patient agreed that either LCSW will e-mail the consent or see if the care manager can take it to her.

## 2019-10-07 ENCOUNTER — Other Ambulatory Visit: Payer: Self-pay

## 2019-10-07 ENCOUNTER — Telehealth: Payer: Self-pay | Admitting: Licensed Clinical Social Worker

## 2019-10-07 ENCOUNTER — Encounter: Payer: Self-pay | Admitting: Surgical

## 2019-10-07 ENCOUNTER — Ambulatory Visit (INDEPENDENT_AMBULATORY_CARE_PROVIDER_SITE_OTHER): Payer: Medicaid Other | Admitting: Obstetrics and Gynecology

## 2019-10-07 ENCOUNTER — Encounter: Payer: Self-pay | Admitting: Obstetrics and Gynecology

## 2019-10-07 ENCOUNTER — Ambulatory Visit: Payer: Medicaid Other | Admitting: Licensed Clinical Social Worker

## 2019-10-07 NOTE — Telephone Encounter (Signed)
LCSW attempted two phone calls to patient regarding scheduled phone appointment. LCSW left two vm for patient encouraging her to return call.

## 2019-10-07 NOTE — Progress Notes (Signed)
HPI:      [name] is a 23 y.o. G1P1001 who LMP was No LMP recorded.  Subjective:   She presents today 3 weeks postpartum after vacuum vaginal birth and third-degree tear.  Patient reports she is doing well with no issues.  States her bleeding has become minimal.  Reports no problems with voiding or bowel movements.  Her baby is still in the NICU but is expected to come home later this week or early next week.  They have just removed her feeding tube.  Patient is pumping breastmilk daily.    Hx: The following portions of the patient's history were reviewed and updated as appropriate:             She  has a past medical history of Anxiety, Asthma, Depression, Genital herpes, and Seasonal allergies. She does not have any pertinent problems on file. She  has a past surgical history that includes Tonsillectomy. Her family history includes Seizures in her brother. She  reports that she has never smoked. She has never used smokeless tobacco. She reports that she does not drink alcohol or use drugs. She has a current medication list which includes the following prescription(s): acetaminophen, beclomethasone, escitalopram, ferrous sulfate, fluticasone, ibuprofen, montelukast, prenatal multivitamin, proair hfa, simethicone, witch hazel-glycerin, coconut oil, and senna-docusate. She is allergic to clindamycin/lincomycin and zithromax [azithromycin].       Review of Systems:  Review of Systems  Constitutional: Denied constitutional symptoms, night sweats, recent illness, fatigue, fever, insomnia and weight loss.  Eyes: Denied eye symptoms, eye pain, photophobia, vision change and visual disturbance.  Ears/Nose/Throat/Neck: Denied ear, nose, throat or neck symptoms, hearing loss, nasal discharge, sinus congestion and sore throat.  Cardiovascular: Denied cardiovascular symptoms, arrhythmia, chest pain/pressure, edema, exercise intolerance, orthopnea and palpitations.  Respiratory:  Denied pulmonary symptoms, asthma, pleuritic pain, productive sputum, cough, dyspnea and wheezing.  Gastrointestinal: Denied, gastro-esophageal reflux, melena, nausea and vomiting.  Genitourinary: Denied genitourinary symptoms including symptomatic vaginal discharge, pelvic relaxation issues, and urinary complaints.  Musculoskeletal: Denied musculoskeletal symptoms, stiffness, swelling, muscle weakness and myalgia.  Dermatologic: Denied dermatology symptoms, rash and scar.  Neurologic: Denied neurology symptoms, dizziness, headache, neck pain and syncope.  Psychiatric: Denied psychiatric symptoms, anxiety and depression.  Endocrine: Denied endocrine symptoms including hot flashes and night sweats.   Meds:   Current Outpatient Medications on File Prior to Visit  Medication Sig Dispense Refill  . acetaminophen (TYLENOL) 325 MG tablet Take 2 tablets (650 mg total) by mouth every 4 (four) hours as needed (for pain scale < 4). 48 tablet 0  . beclomethasone (QVAR) 40 MCG/ACT inhaler Inhale 2 puffs into the lungs 2 (two) times daily. 1 Inhaler 0  . escitalopram (LEXAPRO) 10 MG tablet Take 1 tablet (10 mg total) by mouth daily. 30 tablet 2  . ferrous sulfate 325 (65 FE) MG tablet Take 1 tablet (325 mg total) by mouth 2 (two) times daily with a meal. 60 tablet 3  . fluticasone (FLONASE) 50 MCG/ACT nasal spray 1 spray by Each Nare route daily.    Marland Kitchen ibuprofen (ADVIL) 600 MG tablet Take 1 tablet (600 mg total) by mouth every 6 (six) hours. 30 tablet 0  . montelukast (SINGULAIR) 10 MG tablet Take 10 mg by mouth at bedtime.    . Prenatal Vit-Fe Fumarate-FA (PRENATAL MULTIVITAMIN) TABS tablet Take 1 tablet by mouth daily at 12 noon.    Marland Kitchen PROAIR HFA 108 (90 Base) MCG/ACT inhaler INHALE 2 PUFFS BY MOUTH EVERY 4  HOURS AS NEEDED FOR COUGH OR WHEEZING 8.5 g 0  . simethicone (MYLICON) 80 MG chewable tablet Chew 1 tablet (80 mg total) by mouth as needed for flatulence. 30 tablet 0  . witch hazel-glycerin (TUCKS)  pad Apply 1 application topically as needed for hemorrhoids. 40 each 12  . coconut oil OIL Apply 1 application topically as needed. (Patient not taking: Reported on 10/07/2019) 120 mL 0  . senna-docusate (SENOKOT-S) 8.6-50 MG tablet Take 2 tablets by mouth daily. (Patient not taking: Reported on 10/07/2019) 30 tablet 0   No current facility-administered medications on file prior to visit.     Objective:     Vitals:   10/07/19 1411  BP: 116/74  Pulse: 77                Assessment:    G1P1001 Patient Active Problem List   Diagnosis Date Noted  . Type 3b perineal laceration during delivery 09/14/2019  . Genital herpes 05/22/2019  . Asthma 05/22/2019  . History of depression 05/22/2019     1. Postpartum care and examination immediately after delivery   2. Third degree perineal laceration     Patient doing very well postpartum and has had no issues with her perineal laceration.   Plan:            1.  Patient strongly desires a return to work.  She says she has an easy job and it does not involve any heavy lifting.  Return to work note given.  2.  Does not desire birth control at this time.  I have discussed multiple birth control types with her and she will decide by her 6-week checkup. Orders No orders of the defined types were placed in this encounter.   No orders of the defined types were placed in this encounter.     F/U  Return in about 3 weeks (around 10/28/2019).  Selena Rogers, M.D. 10/07/2019 2:38 PM

## 2019-10-07 NOTE — Progress Notes (Signed)
Patient comes in today for 6 week PPV. Does not want BC. She is still pumping. Baby is still at [facility]. No concerns.

## 2019-10-10 ENCOUNTER — Encounter: Payer: Medicaid Other | Admitting: Certified Nurse Midwife

## 2019-10-10 ENCOUNTER — Encounter: Payer: Medicaid Other | Admitting: Obstetrics and Gynecology

## 2019-10-13 NOTE — Telephone Encounter (Signed)
LCSW attempted to call patient - it appeared the the call was connected but no one spoke and when LCSW asked to speak with patient there was no response.

## 2019-10-20 NOTE — Telephone Encounter (Signed)
Attempted call to patient. LCSW left voicemail for patient to return call. No further follow up will be made at this time.

## 2019-10-28 ENCOUNTER — Telehealth: Payer: Self-pay | Admitting: Obstetrics and Gynecology

## 2019-10-28 NOTE — Telephone Encounter (Signed)
Pt mom called in she was wanting to talk to a nurse before her visit tom. Please advise.

## 2019-10-28 NOTE — Telephone Encounter (Signed)
Patient stated that her mom thinks she would be better off on Zoloft than Lexapro. I told patient that Dr. Amalia Hailey will discuss tomorrow.

## 2019-10-29 ENCOUNTER — Other Ambulatory Visit: Payer: Self-pay

## 2019-10-29 ENCOUNTER — Ambulatory Visit (INDEPENDENT_AMBULATORY_CARE_PROVIDER_SITE_OTHER): Payer: Medicaid Other | Admitting: Obstetrics and Gynecology

## 2019-10-29 ENCOUNTER — Encounter: Payer: Self-pay | Admitting: Obstetrics and Gynecology

## 2019-10-29 DIAGNOSIS — Z30011 Encounter for initial prescription of contraceptive pills: Secondary | ICD-10-CM

## 2019-10-29 DIAGNOSIS — Z3009 Encounter for other general counseling and advice on contraception: Secondary | ICD-10-CM

## 2019-10-29 DIAGNOSIS — Z8659 Personal history of other mental and behavioral disorders: Secondary | ICD-10-CM

## 2019-10-29 MED ORDER — SERTRALINE HCL 50 MG PO TABS: 50.0000 mg | ORAL_TABLET | Freq: Every day | ORAL | 1 refills | Status: DC

## 2019-10-29 MED ORDER — LO LOESTRIN FE 1 MG-10 MCG / 10 MCG PO TABS: 1.0000 | ORAL_TABLET | Freq: Every day | ORAL | 2 refills | Status: DC

## 2019-10-29 NOTE — Progress Notes (Signed)
HPI:      Selena Rogers is a 23 y.o. G1P1001 who LMP was No LMP recorded.  Subjective:   Selena Rogers presents today approximately 6 weeks postpartum.  Selena Rogers says that Selena Rogers feels well.  Selena Rogers has had no problems with bowel movements urinating or any issues with her third degree tear. Selena Rogers has not resumed intercourse.  Selena Rogers is considering birth control.  Selena Rogers would like to discuss multiple methods and make a decision. Selena Rogers has been taking antidepressant and Selena Rogers has complained of some side effects.  Selena Rogers would like to change to a different medication. Baby has come home from the hospital and is doing well-no further seizures. Patient has stopped breast-feeding.  Selena Rogers has not yet had a menstrual period.    Hx: The following portions of the patient's history were reviewed and updated as appropriate:             Selena Rogers  has a past medical history of Anxiety, Asthma, Depression, Genital herpes, and Seasonal allergies. Selena Rogers does not have any pertinent problems on file. Selena Rogers  has a past surgical history that includes Tonsillectomy. Her family history includes Seizures in her brother. Selena Rogers  reports that Selena Rogers has never smoked. Selena Rogers has never used smokeless tobacco. Selena Rogers reports that Selena Rogers does not drink alcohol or use drugs. Selena Rogers has a current medication list which includes the following prescription(s): beclomethasone, escitalopram, fluticasone, montelukast, proair hfa, acetaminophen, coconut oil, ferrous sulfate, ibuprofen, lo loestrin fe, prenatal multivitamin, senna-docusate, sertraline, simethicone, and witch hazel-glycerin. Selena Rogers is allergic to clindamycin/lincomycin and zithromax [azithromycin].       Review of Systems:  Review of Systems  Constitutional: Denied constitutional symptoms, night sweats, recent illness, fatigue, fever, insomnia and weight loss.  Eyes: Denied eye symptoms, eye pain, photophobia, vision change and visual disturbance.  Ears/Nose/Throat/Neck: Denied ear, nose, throat or neck  symptoms, hearing loss, nasal discharge, sinus congestion and sore throat.  Cardiovascular: Denied cardiovascular symptoms, arrhythmia, chest pain/pressure, edema, exercise intolerance, orthopnea and palpitations.  Respiratory: Denied pulmonary symptoms, asthma, pleuritic pain, productive sputum, cough, dyspnea and wheezing.  Gastrointestinal: Denied, gastro-esophageal reflux, melena, nausea and vomiting.  Genitourinary: Denied genitourinary symptoms including symptomatic vaginal discharge, pelvic relaxation issues, and urinary complaints.  Musculoskeletal: Denied musculoskeletal symptoms, stiffness, swelling, muscle weakness and myalgia.  Dermatologic: Denied dermatology symptoms, rash and scar.  Neurologic: Denied neurology symptoms, dizziness, headache, neck pain and syncope.  Psychiatric: Denied psychiatric symptoms, anxiety and depression.  Endocrine: Denied endocrine symptoms including hot flashes and night sweats.   Meds:   Current Outpatient Medications on File Prior to Visit  Medication Sig Dispense Refill  . beclomethasone (QVAR) 40 MCG/ACT inhaler Inhale 2 puffs into the lungs 2 (two) times daily. 1 Inhaler 0  . escitalopram (LEXAPRO) 10 MG tablet Take 1 tablet (10 mg total) by mouth daily. 30 tablet 2  . fluticasone (FLONASE) 50 MCG/ACT nasal spray 1 spray by Each Nare route daily.    . montelukast (SINGULAIR) 10 MG tablet Take 10 mg by mouth at bedtime.    Marland Kitchen PROAIR HFA 108 (90 Base) MCG/ACT inhaler INHALE 2 PUFFS BY MOUTH EVERY 4 HOURS AS NEEDED FOR COUGH OR WHEEZING 8.5 g 0  . acetaminophen (TYLENOL) 325 MG tablet Take 2 tablets (650 mg total) by mouth every 4 (four) hours as needed (for pain scale < 4). (Patient not taking: Reported on 10/29/2019) 48 tablet 0  . coconut oil OIL Apply 1 application topically as needed. (Patient not taking: Reported on 10/07/2019) 120 mL  0  . ferrous sulfate 325 (65 FE) MG tablet Take 1 tablet (325 mg total) by mouth 2 (two) times daily with a meal.  (Patient not taking: Reported on 10/29/2019) 60 tablet 3  . ibuprofen (ADVIL) 600 MG tablet Take 1 tablet (600 mg total) by mouth every 6 (six) hours. (Patient not taking: Reported on 10/29/2019) 30 tablet 0  . Prenatal Vit-Fe Fumarate-FA (PRENATAL MULTIVITAMIN) TABS tablet Take 1 tablet by mouth daily at 12 noon.    . senna-docusate (SENOKOT-S) 8.6-50 MG tablet Take 2 tablets by mouth daily. (Patient not taking: Reported on 10/07/2019) 30 tablet 0  . simethicone (MYLICON) 80 MG chewable tablet Chew 1 tablet (80 mg total) by mouth as needed for flatulence. (Patient not taking: Reported on 10/29/2019) 30 tablet 0  . witch hazel-glycerin (TUCKS) pad Apply 1 application topically as needed for hemorrhoids. (Patient not taking: Reported on 10/29/2019) 40 each 12   No current facility-administered medications on file prior to visit.    Objective:     Vitals:   10/29/19 0953  BP: 122/84  Pulse: (!) 101              Physical examination   Pelvic:   Vulva: Normal appearance.  No lesions.  Vagina: No lesions or abnormalities noted.  Surgical repair of posterior fourchette and perineal body intact and healed well.  Support: Normal pelvic support.  Urethra No masses tenderness or scarring.  Meatus Normal size without lesions or prolapse.  Cervix: Normal appearance.  No lesions.  Anus: Normal exam.  No lesions.  Perineum: Normal exam.  No lesions.        Bimanual   Uterus: Normal size.  Non-tender.  Mobile.  AV.  Adnexae: No masses.  Non-tender to palpation.  Cul-de-sac: Negative for abnormality.     Assessment:    G1P1001 Patient Active Problem List   Diagnosis Date Noted  . Type 3b perineal laceration during delivery 09/14/2019  . Genital herpes 05/22/2019  . Asthma 05/22/2019  . History of depression 05/22/2019     1. Postpartum care and examination immediately after delivery   2. History of depression   3. Initiation of OCP (BCP)   4. Birth control counseling     After long  discussion patient has decided upon OCPs for birth control  Patient has no plans to resume intercourse at this time but would like to begin birth control  Unhappy with her current medication for depression and would like to try something else.   Plan:            1.  Change antidepressant to Zoloft  2.  OCPs The risks /benefits of OCPs have been explained to the patient in detail.  Product literature has been given to her.  I have instructed her in the use of OCPs and have given her literature reinforcing this information.  I have explained to the patient that OCPs are not as effective for birth control during the first month of use, and that another form of contraception should be used during this time.  Both first-day start and Sunday start have been explained.  The risks and benefits of each was discussed.  Selena Rogers has been made aware of  the fact that other medications may affect the efficacy of OCPs.  I have answered all of her questions, and I believe that Selena Rogers has an understanding of the effectiveness and use of OCPs. We will start low Loestrin -patient interested in first day first day start  Orders No orders of the defined types were placed in this encounter.    Meds ordered this encounter  Medications  . Norethindrone-Ethinyl Estradiol-Fe Biphas (LO LOESTRIN FE) 1 MG-10 MCG / 10 MCG tablet    Sig: Take 1 tablet by mouth at bedtime for 28 days.    Dispense:  1 Package    Refill:  2  . sertraline (ZOLOFT) 50 MG tablet    Sig: Take 1 tablet (50 mg total) by mouth daily.    Dispense:  30 tablet    Refill:  1      F/U  Return in about 3 months (around 01/27/2020).  Finis Bud, M.D. 10/29/2019 10:20 AM

## 2019-12-24 ENCOUNTER — Other Ambulatory Visit: Payer: Self-pay | Admitting: Obstetrics and Gynecology

## 2019-12-24 DIAGNOSIS — Z8659 Personal history of other mental and behavioral disorders: Secondary | ICD-10-CM

## 2020-01-23 ENCOUNTER — Other Ambulatory Visit: Payer: Self-pay | Admitting: Obstetrics and Gynecology

## 2020-01-23 DIAGNOSIS — Z30011 Encounter for initial prescription of contraceptive pills: Secondary | ICD-10-CM

## 2020-01-23 DIAGNOSIS — Z8659 Personal history of other mental and behavioral disorders: Secondary | ICD-10-CM

## 2020-01-29 ENCOUNTER — Encounter: Payer: Self-pay | Admitting: Certified Nurse Midwife

## 2020-01-29 ENCOUNTER — Ambulatory Visit (INDEPENDENT_AMBULATORY_CARE_PROVIDER_SITE_OTHER): Payer: Medicaid Other | Admitting: Certified Nurse Midwife

## 2020-01-29 ENCOUNTER — Other Ambulatory Visit: Payer: Self-pay

## 2020-01-29 VITALS — BP 116/69 | HR 71 | Ht 61.0 in | Wt 122.1 lb

## 2020-01-29 DIAGNOSIS — Z0000 Encounter for general adult medical examination without abnormal findings: Secondary | ICD-10-CM

## 2020-01-29 DIAGNOSIS — Z8659 Personal history of other mental and behavioral disorders: Secondary | ICD-10-CM | POA: Diagnosis not present

## 2020-01-29 DIAGNOSIS — Z862 Personal history of diseases of the blood and blood-forming organs and certain disorders involving the immune mechanism: Secondary | ICD-10-CM | POA: Diagnosis not present

## 2020-01-29 DIAGNOSIS — Z01419 Encounter for gynecological examination (general) (routine) without abnormal findings: Secondary | ICD-10-CM

## 2020-01-29 MED ORDER — SERTRALINE HCL 50 MG PO TABS: 50.0000 mg | ORAL_TABLET | Freq: Every day | ORAL | 11 refills | Status: DC

## 2020-01-29 NOTE — Patient Instructions (Addendum)
Anemia  Anemia is a condition in which you do not have enough red blood cells or hemoglobin. Hemoglobin is a substance in red blood cells that carries oxygen. When you do not have enough red blood cells or hemoglobin (are anemic), your body cannot get enough oxygen and your organs may not work properly. As a result, you may feel very tired or have other problems. What are the causes? Common causes of anemia include:  Excessive bleeding. Anemia can be caused by excessive bleeding inside or outside the body, including bleeding from the intestine or from periods in women.  Poor nutrition.  Long-lasting (chronic) kidney, thyroid, and liver disease.  Bone marrow disorders.  Cancer and treatments for cancer.  HIV (human immunodeficiency virus) and AIDS (acquired immunodeficiency syndrome).  Treatments for HIV and AIDS.  Spleen problems.  Blood disorders.  Infections, medicines, and autoimmune disorders that destroy red blood cells. What are the signs or symptoms? Symptoms of this condition include:  Minor weakness.  Dizziness.  Headache.  Feeling heartbeats that are irregular or faster than normal (palpitations).  Shortness of breath, especially with exercise.  Paleness.  Cold sensitivity.  Indigestion.  Nausea.  Difficulty sleeping.  Difficulty concentrating. Symptoms may occur suddenly or develop slowly. If your anemia is mild, you may not have symptoms. How is this diagnosed? This condition is diagnosed based on:  Blood tests.  Your medical history.  A physical exam.  Bone marrow biopsy. Your health care provider may also check your stool (feces) for blood and may do additional testing to look for the cause of your bleeding. You may also have other tests, including:  Imaging tests, such as a CT scan or MRI.  Endoscopy.  Colonoscopy. How is this treated? Treatment for this condition depends on the cause. If you continue to lose a lot of blood, you may  need to be treated at a hospital. Treatment may include:  Taking supplements of iron, vitamin S31, or folic acid.  Taking a hormone medicine (erythropoietin) that can help to stimulate red blood cell growth.  Having a blood transfusion. This may be needed if you lose a lot of blood.  Making changes to your diet.  Having surgery to remove your spleen. Follow these instructions at home:  Take over-the-counter and prescription medicines only as told by your health care provider.  Take supplements only as told by your health care provider.  Follow any diet instructions that you were given.  Keep all follow-up visits as told by your health care provider. This is important. Contact a health care provider if:  You develop new bleeding anywhere in the body. Get help right away if:  You are very weak.  You are short of breath.  You have pain in your abdomen or chest.  You are dizzy or feel faint.  You have trouble concentrating.  You have bloody or black, tarry stools.  You vomit repeatedly or you vomit up blood. Summary  Anemia is a condition in which you do not have enough red blood cells or enough of a substance in your red blood cells that carries oxygen (hemoglobin).  Symptoms may occur suddenly or develop slowly.  If your anemia is mild, you may not have symptoms.  This condition is diagnosed with blood tests as well as a medical history and physical exam. Other tests may be needed.  Treatment for this condition depends on the cause of the anemia. This information is not intended to replace advice given to you by  your health care provider. Make sure you discuss any questions you have with your health care provider. Document Revised: 10/05/2017 Document Reviewed: 11/24/2016 Elsevier Patient Education  [employer].   Sertraline tablets What is this medicine? SERTRALINE (SER tra leen) is used to treat depression. It may also be used to treat obsessive  compulsive disorder, panic disorder, post-trauma stress, premenstrual dysphoric disorder (PMDD) or social anxiety. This medicine may be used for other purposes; ask your health care provider or pharmacist if you have questions. COMMON BRAND NAME(S): Zoloft What should I tell my health care provider before I take this medicine? They need to know if you have any of these conditions:  bleeding disorders  bipolar disorder or a family history of bipolar disorder  glaucoma  heart disease  high blood pressure  history of irregular heartbeat  history of low levels of calcium, magnesium, or potassium in the blood  if you often drink alcohol  liver disease  receiving electroconvulsive therapy  seizures  suicidal thoughts, plans, or attempt; a previous suicide attempt by you or a family member  take medicines that treat or prevent blood clots  thyroid disease  an unusual or allergic reaction to sertraline, other medicines, foods, dyes, or preservatives  pregnant or trying to get pregnant  breast-feeding How should I use this medicine? Take this medicine by mouth with a glass of water. Follow the directions on the prescription label. You can take it with or without food. Take your medicine at regular intervals. Do not take your medicine more often than directed. Do not stop taking this medicine suddenly except upon the advice of your doctor. Stopping this medicine too quickly may cause serious side effects or your condition may worsen. A special MedGuide will be given to you by the pharmacist with each prescription and refill. Be sure to read this information carefully each time. Talk to your pediatrician regarding the use of this medicine in children. While this drug may be prescribed for children as young as 7 years for selected conditions, precautions do apply. Overdosage: If you think you have taken too much of this medicine contact a poison control center or emergency room at  once. NOTE: This medicine is only for you. Do not share this medicine with others. What if I miss a dose? If you miss a dose, take it as soon as you can. If it is almost time for your next dose, take only that dose. Do not take double or extra doses. What may interact with this medicine? Do not take this medicine with any of the following medications:  cisapride  dronedarone  linezolid  MAOIs like Carbex, Eldepryl, Marplan, Nardil, and Parnate  methylene blue (injected into a vein)  pimozide  thioridazine This medicine may also interact with the following medications:  alcohol  amphetamines  aspirin and aspirin-like medicines  certain medicines for depression, anxiety, or psychotic disturbances  certain medicines for fungal infections like ketoconazole, fluconazole, posaconazole, and itraconazole  certain medicines for irregular heart beat like flecainide, quinidine, propafenone  certain medicines for migraine headaches like almotriptan, eletriptan, frovatriptan, naratriptan, rizatriptan, sumatriptan, zolmitriptan  certain medicines for sleep  certain medicines for seizures like carbamazepine, valproic acid, phenytoin  certain medicines that treat or prevent blood clots like warfarin, enoxaparin, dalteparin  cimetidine  digoxin  diuretics  fentanyl  isoniazid  lithium  NSAIDs, medicines for pain and inflammation, like ibuprofen or naproxen  other medicines that prolong the QT interval (cause an abnormal heart rhythm) like  dofetilide  rasagiline  safinamide  supplements like St. John's wort, kava kava, valerian  tolbutamide  tramadol  tryptophan This list may not describe all possible interactions. Give your health care provider a list of all the medicines, herbs, non-prescription drugs, or dietary supplements you use. Also tell them if you smoke, drink alcohol, or use illegal drugs. Some items may interact with your medicine. What should I watch  for while using this medicine? Tell your doctor if your symptoms do not get better or if they get worse. Visit your doctor or health care professional for regular checks on your progress. Because it may take several weeks to see the full effects of this medicine, it is important to continue your treatment as prescribed by your doctor. Patients and their families should watch out for new or worsening thoughts of suicide or depression. Also watch out for sudden changes in feelings such as feeling anxious, agitated, panicky, irritable, hostile, aggressive, impulsive, severely restless, overly excited and hyperactive, or not being able to sleep. If this happens, especially at the beginning of treatment or after a change in dose, call your health care professional. Dennis Bast may get drowsy or dizzy. Do not drive, use machinery, or do anything that needs mental alertness until you know how this medicine affects you. Do not stand or sit up quickly, especially if you are an older patient. This reduces the risk of dizzy or fainting spells. Alcohol may interfere with the effect of this medicine. Avoid alcoholic drinks. Your mouth may get dry. Chewing sugarless gum or sucking hard candy, and drinking plenty of water may help. Contact your doctor if the problem does not go away or is severe. What side effects may I notice from receiving this medicine? Side effects that you should report to your doctor or health care professional as soon as possible:  allergic reactions like skin rash, itching or hives, swelling of the face, lips, or tongue  anxious  black, tarry stools  changes in vision  confusion  elevated mood, decreased need for sleep, racing thoughts, impulsive behavior  eye pain  fast, irregular heartbeat  feeling faint or lightheaded, falls  feeling agitated, angry, or irritable  hallucination, loss of contact with reality  loss of balance or coordination  loss of memory  painful or prolonged  erections  restlessness, pacing, inability to keep still  seizures  stiff muscles  suicidal thoughts or other mood changes  trouble sleeping  unusual bleeding or bruising  unusually weak or tired  vomiting Side effects that usually do not require medical attention (report to your doctor or health care professional if they continue or are bothersome):  change in appetite or weight  change in sex drive or performance  diarrhea  increased sweating  indigestion, nausea  tremors This list may not describe all possible side effects. Call your doctor for medical advice about side effects. You may report side effects to FDA at 1-800-FDA-1088. Where should I keep my medicine? Keep out of the reach of children. Store at room temperature between 15 and 30 degrees C (59 and 86 degrees F). Throw away any unused medicine after the expiration date. NOTE: This sheet is a summary. It may not cover all possible information. If you have questions about this medicine, talk to your doctor, pharmacist, or health care provider.  2020 Elsevier/Gold Standard (2018-10-15 10:09:27)   Preventive Care 19-8 Years Old, Female Preventive care refers to visits with your health care provider and lifestyle choices that can promote  health and wellness. This includes:  A yearly physical exam. This may also be called an annual well check.  Regular dental visits and eye exams.  Immunizations.  Screening for certain conditions.  Healthy lifestyle choices, such as eating a healthy diet, getting regular exercise, not using drugs or products that contain nicotine and tobacco, and limiting alcohol use. What can I expect for my preventive care visit? Physical exam Your health care provider will check your:  Height and weight. This may be used to calculate body mass index (BMI), which tells if you are at a healthy weight.  Heart rate and blood pressure.  Skin for abnormal spots. Counseling Your  health care provider may ask you questions about your:  Alcohol, tobacco, and drug use.  Emotional well-being.  Home and relationship well-being.  Sexual activity.  Eating habits.  Work and work Statistician.  Method of birth control.  Menstrual cycle.  Pregnancy history. What immunizations do I need?  Influenza (flu) vaccine  This is recommended every year. Tetanus, diphtheria, and pertussis (Tdap) vaccine  You may need a Td booster every 10 years. Varicella (chickenpox) vaccine  You may need this if you have not been vaccinated. Human papillomavirus (HPV) vaccine  If recommended by your health care provider, you may need three doses over 6 months. Measles, mumps, and rubella (MMR) vaccine  You may need at least one dose of MMR. You may also need a second dose. Meningococcal conjugate (MenACWY) vaccine  One dose is recommended if you are age 80-21 years and a first-year college student living in a residence hall, or if you have one of several medical conditions. You may also need additional booster doses. Pneumococcal conjugate (PCV13) vaccine  You may need this if you have certain conditions and were not previously vaccinated. Pneumococcal polysaccharide (PPSV23) vaccine  You may need one or two doses if you smoke cigarettes or if you have certain conditions. Hepatitis A vaccine  You may need this if you have certain conditions or if you travel or work in places where you may be exposed to hepatitis A. Hepatitis B vaccine  You may need this if you have certain conditions or if you travel or work in places where you may be exposed to hepatitis B. Haemophilus influenzae type b (Hib) vaccine  You may need this if you have certain conditions. You may receive vaccines as individual doses or as more than one vaccine together in one shot (combination vaccines). Talk with your health care provider about the risks and benefits of combination vaccines. What tests do I  need?  Blood tests  Lipid and cholesterol levels. These may be checked every 5 years starting at age 20.  Hepatitis C test.  Hepatitis B test. Screening  Diabetes screening. This is done by checking your blood sugar (glucose) after you have not eaten for a while (fasting).  Sexually transmitted disease (STD) testing.  BRCA-related cancer screening. This may be done if you have a family history of breast, ovarian, tubal, or peritoneal cancers.  Pelvic exam and Pap test. This may be done every 3 years starting at age 72. Starting at age 67, this may be done every 5 years if you have a Pap test in combination with an HPV test. Talk with your health care provider about your test results, treatment options, and if necessary, the need for more tests. Follow these instructions at home: Eating and drinking   Eat a diet that includes fresh fruits and vegetables, whole grains,  lean protein, and low-fat dairy.  Take vitamin and mineral supplements as recommended by your health care provider.  Do not drink alcohol if: ? Your health care provider tells you not to drink. ? You are pregnant, may be pregnant, or are planning to become pregnant.  If you drink alcohol: ? Limit how much you have to 0-1 drink a day. ? Be aware of how much alcohol is in your drink. In the [place] one drink equals one 12 oz bottle of beer (355 mL), one 5 oz glass of wine (148 mL), or one 1 oz glass of hard liquor (44 mL). Lifestyle  Take daily care of your teeth and gums.  Stay active. Exercise for at least 30 minutes on 5 or more days each week.  Do not use any products that contain nicotine or tobacco, such as cigarettes, e-cigarettes, and chewing tobacco. If you need help quitting, ask your health care provider.  If you are sexually active, practice safe sex. Use a condom or other form of birth control (contraception) in order to prevent pregnancy and STIs (sexually transmitted infections). If you plan to  become pregnant, see your health care provider for a preconception visit. What's next?  Visit your health care provider once a year for a well check visit.  Ask your health care provider how often you should have your eyes and teeth checked.  Stay up to date on all vaccines. This information is not intended to replace advice given to you by your health care provider. Make sure you discuss any questions you have with your health care provider. Document Revised: 07/04/2018 Document Reviewed: 07/04/2018 Elsevier Patient Education  2020 [employer].

## 2020-01-29 NOTE — Progress Notes (Signed)
ANNUAL PREVENTATIVE CARE GYN  ENCOUNTER NOTE  Subjective:       Selena Rogers is a 24 y.o. G37P1001 female here for a routine annual gynecologic exam.    Denies difficulty breathing or respiratory distress, chest pain, abdominal pain, excessive vaginal bleeding, dysuria, and leg pain or swelling.    Gynecologic History  Patient's last menstrual period was 01/04/2020 (approximate). Period Cycle (Days): 24 Period Duration (Days): Three (3) Period Pattern: Regular Menstrual Flow: Moderate, Light Dysmenorrhea: None  Contraception: OCP (estrogen/progesterone), Lo Loestrin  Last Pap: 02/2019. Results were: normal  Obstetric History  OB History  Gravida Para Term Preterm AB Living  1 1 1  0 0 1  SAB TAB Ectopic Multiple Live Births  0 0 0 0 1    # Outcome Date GA Lbr Len/2nd Weight Sex Delivery Anes PTL Lv  1 Term 09/13/19 [redacted]w[redacted]d  8 lb 5 oz (3.771 kg) F Vag-Vacuum EPI N LIV     Complications: Failure to Progress in Second Stage    Past Medical History:  Diagnosis Date  . Anxiety   . Asthma   . Depression   . Genital herpes   . Seasonal allergies     Past Surgical History:  Procedure Laterality Date  . TONSILLECTOMY      Current Outpatient Medications on File Prior to Visit  Medication Sig Dispense Refill  . beclomethasone (QVAR) 40 MCG/ACT inhaler Inhale 2 puffs into the lungs 2 (two) times daily. 1 Inhaler 0  . fluticasone (FLONASE) 50 MCG/ACT nasal spray 1 spray by Each Nare route daily.    . LO LOESTRIN FE 1 MG-10 MCG / 10 MCG tablet TAKE 1 TABLET BY MOUTH AT BEDTIME FOR 28 DAYS. 28 tablet 2  . PROAIR HFA 108 (90 Base) MCG/ACT inhaler INHALE 2 PUFFS BY MOUTH EVERY 4 HOURS AS NEEDED FOR COUGH OR WHEEZING 8.5 g 0  . sertraline (ZOLOFT) 50 MG tablet TAKE 1 TABLET BY MOUTH EVERY DAY 30 tablet 0  . acetaminophen (TYLENOL) 325 MG tablet Take 2 tablets (650 mg total) by mouth every 4 (four) hours as needed (for pain scale < 4). (Patient not taking: Reported on  10/29/2019) 48 tablet 0  . coconut oil OIL Apply 1 application topically as needed. (Patient not taking: Reported on 10/07/2019) 120 mL 0  . escitalopram (LEXAPRO) 10 MG tablet Take 1 tablet (10 mg total) by mouth daily. 30 tablet 2  . ferrous sulfate 325 (65 FE) MG tablet Take 1 tablet (325 mg total) by mouth 2 (two) times daily with a meal. (Patient not taking: Reported on 10/29/2019) 60 tablet 3  . ibuprofen (ADVIL) 600 MG tablet Take 1 tablet (600 mg total) by mouth every 6 (six) hours. (Patient not taking: Reported on 10/29/2019) 30 tablet 0  . montelukast (SINGULAIR) 10 MG tablet Take 10 mg by mouth at bedtime.    . Prenatal Vit-Fe Fumarate-FA (PRENATAL MULTIVITAMIN) TABS tablet Take 1 tablet by mouth daily at 12 noon.    . senna-docusate (SENOKOT-S) 8.6-50 MG tablet Take 2 tablets by mouth daily. (Patient not taking: Reported on 10/07/2019) 30 tablet 0  . simethicone (MYLICON) 80 MG chewable tablet Chew 1 tablet (80 mg total) by mouth as needed for flatulence. (Patient not taking: Reported on 10/29/2019) 30 tablet 0  . witch hazel-glycerin (TUCKS) pad Apply 1 application topically as needed for hemorrhoids. (Patient not taking: Reported on 10/29/2019) 40 each 12   No current facility-administered medications on file prior to visit.  Allergies  Allergen Reactions  . Clindamycin/Lincomycin Rash  . Zithromax [Azithromycin] Rash    Social History   Socioeconomic History  . Marital status: Married    Spouse name: Not on file  . Number of children: Not on file  . Years of education: Not on file  . Highest education level: Not on file  Occupational History  . Not on file  Tobacco Use  . Smoking status: Never Smoker  . Smokeless tobacco: Never Used  Substance and Sexual Activity  . Alcohol use: No  . Drug use: No  . Sexual activity: Not Currently    Birth control/protection: None  Other Topics Concern  . Not on file  Social History Narrative  . Not on file   Social  Determinants of Health   Financial Resource Strain:   . Difficulty of Paying Living Expenses:   Food Insecurity:   . Worried About Programme researcher, broadcasting/film/video in the Last Year:   . Barista in the Last Year:   Transportation Needs:   . Freight forwarder (Medical):   Marland Kitchen Lack of Transportation (Non-Medical):   Physical Activity:   . Days of Exercise per Week:   . Minutes of Exercise per Session:   Stress:   . Feeling of Stress :   Social Connections:   . Frequency of Communication with Friends and Family:   . Frequency of Social Gatherings with Friends and Family:   . Attends Religious Services:   . Active Member of Clubs or Organizations:   . Attends Banker Meetings:   Marland Kitchen Marital Status:   Intimate Partner Violence:   . Fear of Current or Ex-Partner:   . Emotionally Abused:   Marland Kitchen Physically Abused:   . Sexually Abused:     Family History  Problem Relation Age of Onset  . Seizures Brother   . Breast cancer Neg Hx   . Ovarian cancer Neg Hx   . Colon cancer Neg Hx     The following portions of the patient's history were reviewed and updated as appropriate: allergies, current medications, past family history, past medical history, past social history, past surgical history and problem list.  Review of Systems  ROS negative except as noted above. Information obtained from patient.    Objective:   BP 116/69   Pulse 71   Ht 5\' 1"  (1.549 m)   Wt 122 lb 1 oz (55.4 kg)   LMP 01/04/2020 (Approximate)   Breastfeeding No   BMI 23.06 kg/m    CONSTITUTIONAL: Well-developed, well-nourished female in no acute distress.   PSYCHIATRIC: Normal mood and affect. Normal behavior. Normal judgment and thought content.  NEUROLGIC: Alert and oriented to person, place, and time. Normal muscle tone coordination. No cranial nerve deficit noted.  HENT:  Normocephalic, atraumatic, External right and left ear normal.   EYES: Conjunctivae and EOM are normal. Pupils are equal  and round.   NECK: Normal range of motion, supple, no masses.  Normal thyroid.   SKIN: Skin is warm and dry. No rash noted. Not diaphoretic. No erythema. No pallor.  CARDIOVASCULAR: Normal heart rate noted, regular rhythm, no murmur.  RESPIRATORY: Clear to auscultation bilaterally. Effort and breath sounds normal, no problems with respiration noted.  BREASTS: Symmetric in size. No skin changes, nipple drainage, or lymphadenopathy.  ABDOMEN: Soft, normal bowel sounds, no distention noted.  No tenderness, rebound or guarding.   PELVIC: Declined by patient.   MUSCULOSKELETAL: Normal range of motion. No tenderness.  No cyanosis, clubbing, or edema.  2+ distal pulses.  LYMPHATIC: No Axillary, Supraclavicular, or Inguinal Adenopathy.  Depression screen [facility] 2/9 01/29/2020 10/29/2019 10/07/2019 09/18/2019 06/16/2019  Decreased Interest 0 2 0 0 0  Down, Depressed, Hopeless 0 1 0 0 0  PHQ - 2 Score 0 3 0 0 0  Altered sleeping 0 0 0 0 1  Tired, decreased energy 1 1 0 0 1  Change in appetite 0 1 0 0 1  Feeling bad or failure about yourself  0 1 0 0 0  Trouble concentrating 0 0 0 0 0  Moving slowly or fidgety/restless 0 0 0 0 0  Suicidal thoughts 0 0 0 0 0  PHQ-9 Score 1 6 0 0 3  Difficult doing work/chores Not difficult at all Somewhat difficult Not difficult at all - -   GAD 7 : Generalized Anxiety Score 01/29/2020 09/18/2019 06/16/2019  Nervous, Anxious, on Edge 0 0 0  Control/stop worrying 0 2 1  Worry too much - different things 0 0 1  Trouble relaxing 0 2 1  Restless 0 0 0  Easily annoyed or irritable 0 2 1  Afraid - awful might happen 0 0 0  Total GAD 7 Score 0 6 4  Anxiety Difficulty - Not difficult at all Not difficult at all   Assessment:   Annual gynecologic examination 24 y.o.   Contraception: OCP (estrogen/progesterone), Lo Loestrin   Normal BMI   Problem List Items Addressed This Visit      Other   History of depression   Relevant Medications   sertraline  (ZOLOFT) 50 MG tablet (Start on 02/23/2020)    Other Visit Diagnoses    Well woman exam    -  Primary   Relevant Orders   CBC   History of anemia       Relevant Orders   CBC      Plan:   Pap: Not needed  Labs: See orders   Routine preventative health maintenance measures emphasized: Exercise/Diet/Weight control, Tobacco Warnings, Alcohol/Substance use risks and Stress Management; see AVS  Rx Zoloft, see orders  Return to Clinic - 1 Year for [facility] or sooner if needed   Gunnar Bulla, CNM [facility], [facility] 01/29/20 11:22 AM

## 2020-01-30 LAB — CBC
Hematocrit: 39.8 % (ref 34.0–46.6)
Hemoglobin: 12.9 g/dL (ref 11.1–15.9)
MCH: 26 pg — ABNORMAL LOW (ref 26.6–33.0)
MCHC: 32.4 g/dL (ref 31.5–35.7)
MCV: 80 fL (ref 79–97)
Platelets: 324 10*3/uL (ref 150–450)
RBC: 4.97 x10E6/uL (ref 3.77–5.28)
RDW: 14.9 % (ref 11.7–15.4)
WBC: 6 10*3/uL (ref 3.4–10.8)

## 2020-05-11 ENCOUNTER — Other Ambulatory Visit: Payer: Self-pay | Admitting: Certified Nurse Midwife

## 2020-05-11 DIAGNOSIS — Z30011 Encounter for initial prescription of contraceptive pills: Secondary | ICD-10-CM

## 2020-05-12 ENCOUNTER — Other Ambulatory Visit: Payer: Self-pay

## 2020-05-12 DIAGNOSIS — Z30011 Encounter for initial prescription of contraceptive pills: Secondary | ICD-10-CM

## 2020-06-29 ENCOUNTER — Other Ambulatory Visit: Payer: Self-pay | Admitting: Certified Nurse Midwife

## 2020-06-29 DIAGNOSIS — Z8659 Personal history of other mental and behavioral disorders: Secondary | ICD-10-CM

## 2020-07-29 ENCOUNTER — Other Ambulatory Visit: Payer: Self-pay | Admitting: Certified Nurse Midwife

## 2020-07-29 ENCOUNTER — Telehealth: Payer: Self-pay

## 2020-07-29 DIAGNOSIS — Z30011 Encounter for initial prescription of contraceptive pills: Secondary | ICD-10-CM

## 2020-07-29 NOTE — Telephone Encounter (Signed)
mychart message sent

## 2020-08-01 IMAGING — CT CT ABD-PELV W/O CM
2 of 4 series · 16 of 46 positions shown, 18 images · non-contrast
Comparison: None.

CLINICAL DATA: Low back pain to RIGHT flank pain, RIGHT lower
abdominal pain, for 3 days.

EXAM:
CT ABDOMEN AND PELVIS WITHOUT CONTRAST
TECHNIQUE: Multidetector CT imaging of the abdomen and pelvis was performed
following the standard protocol without IV contrast.

[Series 2: routine abd/pel wo · axial · 0.60mm/px · z∈[-117,+253]mm · 13 of 82 slices shown, 15 images]
[im 4/82  soft-tissue]
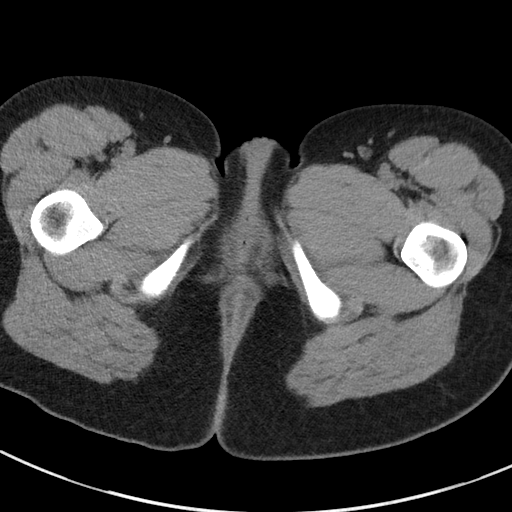
[im 4/82  bone]
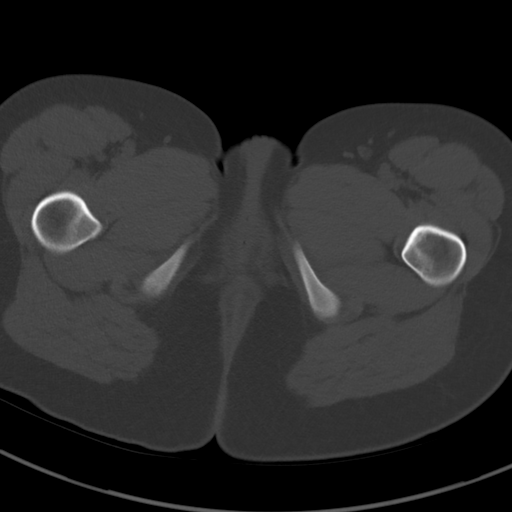
[im 10/82  soft-tissue]
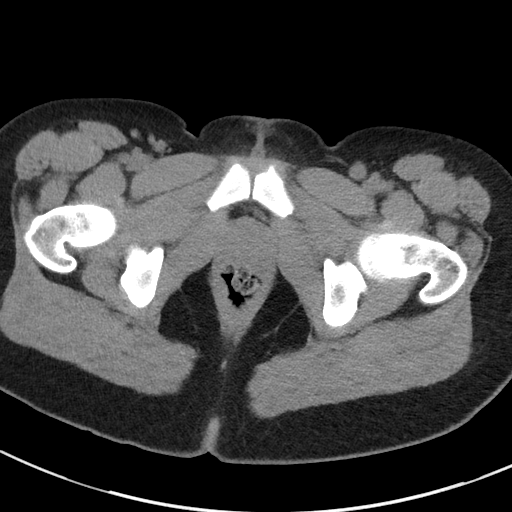
[im 16/82  soft-tissue]
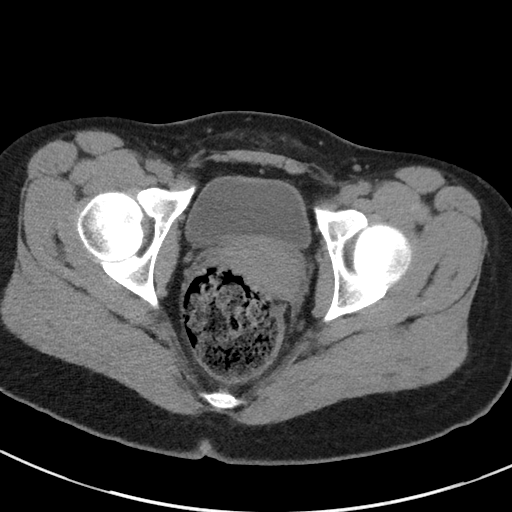
[im 22/82  soft-tissue]
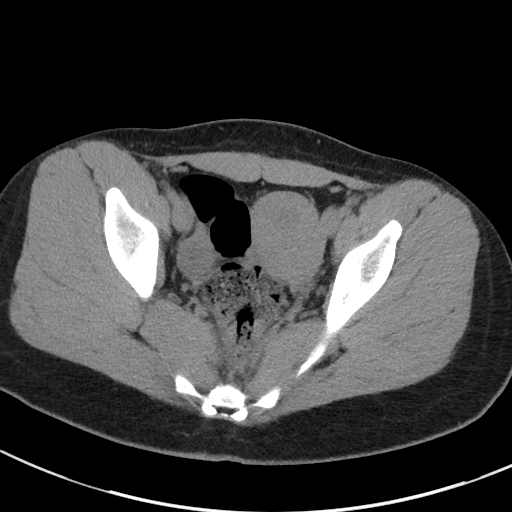
[im 29/82  soft-tissue]
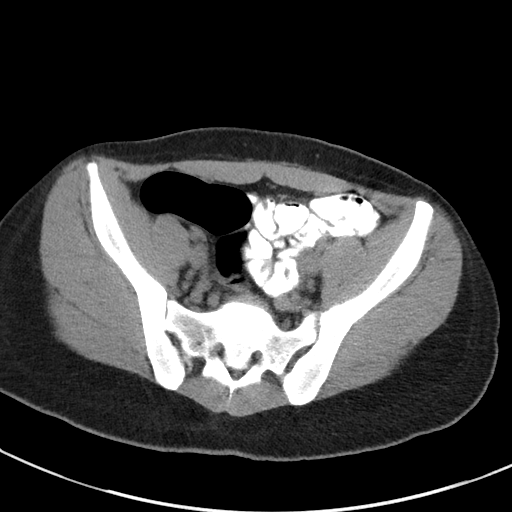
[im 35/82  soft-tissue]
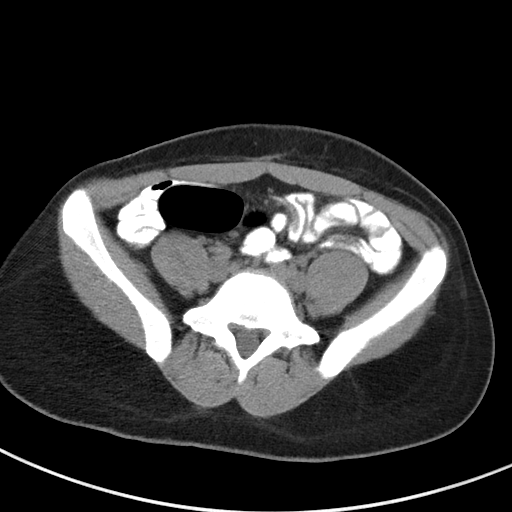
[im 41/82  soft-tissue]
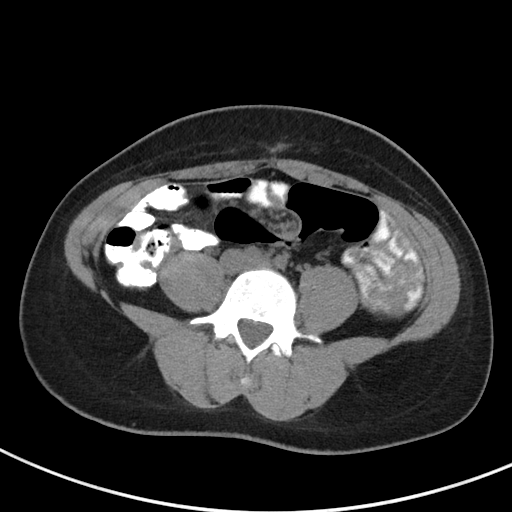
[im 47/82  soft-tissue]
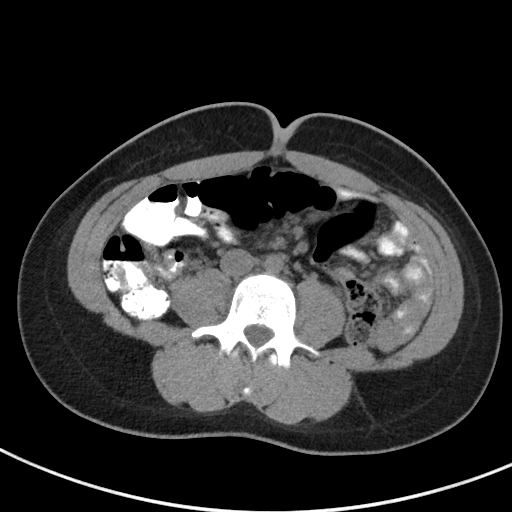
[im 53/82  soft-tissue]
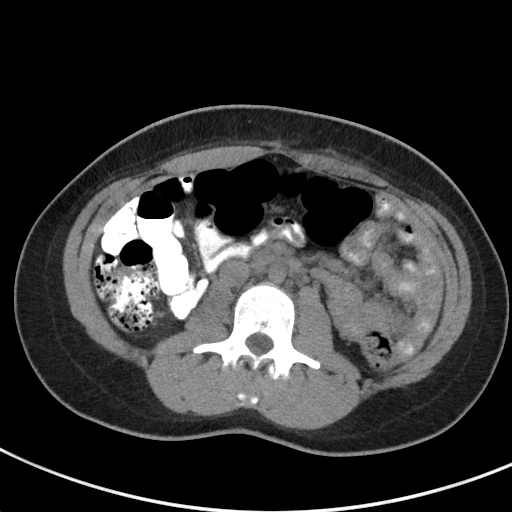
[im 53/82  bone]
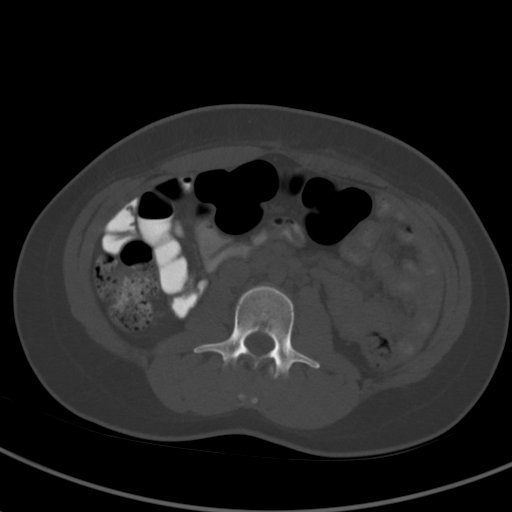
[im 60/82  soft-tissue]
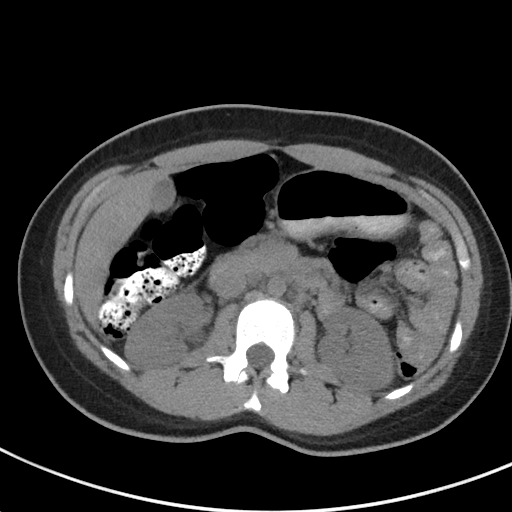
[im 66/82  soft-tissue]
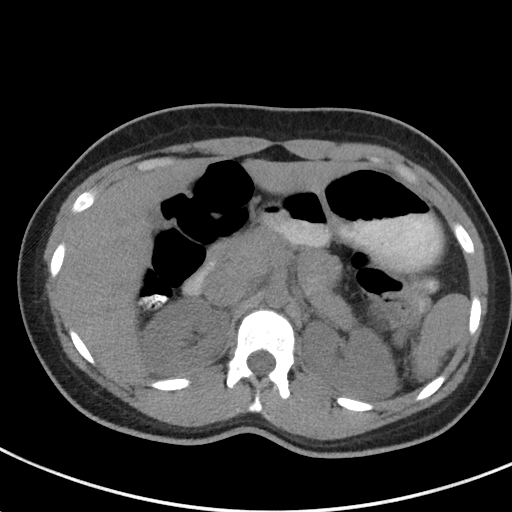
[im 72/82  soft-tissue]
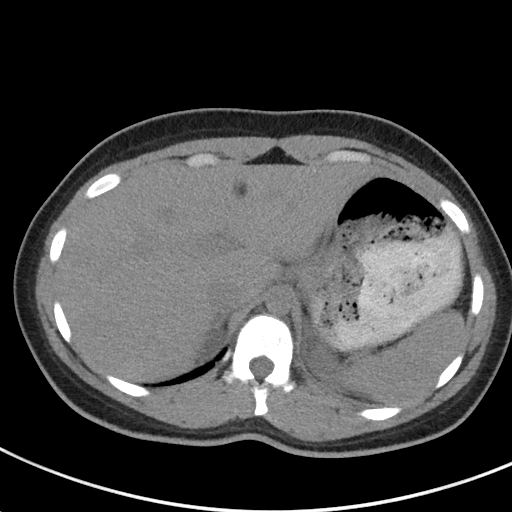
[im 78/82  soft-tissue]
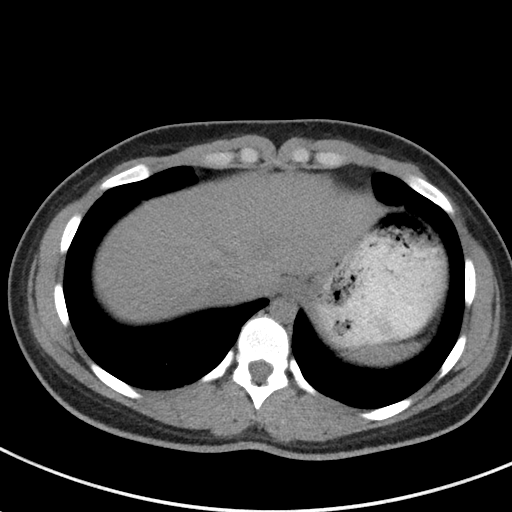

[Series 5: coronal st · coronal · 0.59mm/px · 3 of 69 slices shown]
[im 23/69  soft-tissue]
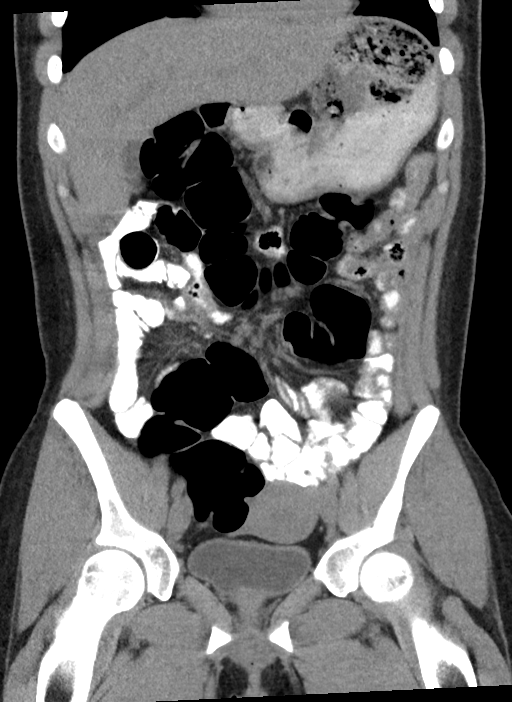
[im 31/69  soft-tissue]
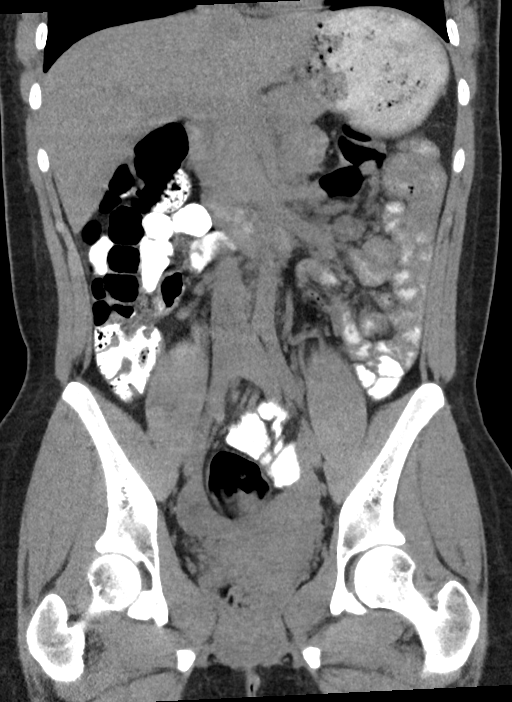
[im 38/69  soft-tissue]
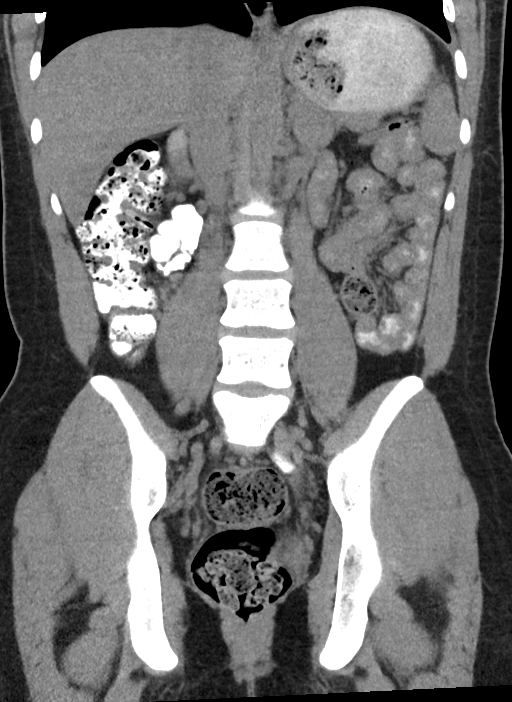

[16 of 46 positions shown; findings below may reference images not displayed]

FINDINGS: Lower chest: No acute abnormality.

Hepatobiliary: No focal liver abnormality is seen. No gallstones,
gallbladder wall thickening, or biliary dilatation.

Pancreas: Poorly seen due to the abutting bowel loops and vascular
structures. No peripancreatic fluid or inflammation.

Spleen: Normal in size without focal abnormality.

Adrenals/Urinary Tract: Adrenal glands are unremarkable. Kidneys
appear normal without mass, stone or hydronephrosis. No perinephric
fluid. No ureteral or bladder calculi identified. Bladder is
unremarkable, partially decompressed.

Stomach/Bowel: No bowel obstruction or convincing evidence of bowel
wall inflammation. Moderate amount of stool and gas within the
rectosigmoid colon. Appendix is not convincingly seen but there are
no inflammatory changes about the cecum to suggest acute
appendicitis.

Vascular/Lymphatic: No significant vascular findings are present. No
enlarged abdominal or pelvic lymph nodes.

Reproductive: Uterus and bilateral adnexa are unremarkable.

Other: No free fluid or abscess collection. No free intraperitoneal
air.

Musculoskeletal: No acute or significant osseous findings.
IMPRESSION: 1. No acute findings within the abdomen or pelvis. No renal or
ureteral calculi. No bowel obstruction or evidence of bowel wall
inflammation. No free fluid. Appendix is not convincingly seen but
there are no inflammatory changes about the cecum to suggest acute
appendicitis.
2. Moderate amount of gas and stool in the rectosigmoid colon
(constipation?).

## 2021-01-16 ENCOUNTER — Other Ambulatory Visit: Payer: Self-pay | Admitting: Certified Nurse Midwife

## 2021-01-16 DIAGNOSIS — Z30011 Encounter for initial prescription of contraceptive pills: Secondary | ICD-10-CM

## 2021-01-31 ENCOUNTER — Encounter: Payer: Self-pay | Admitting: Certified Nurse Midwife

## 2021-01-31 ENCOUNTER — Ambulatory Visit (INDEPENDENT_AMBULATORY_CARE_PROVIDER_SITE_OTHER): Payer: Medicaid Other | Admitting: Certified Nurse Midwife

## 2021-01-31 ENCOUNTER — Other Ambulatory Visit: Payer: Self-pay

## 2021-01-31 VITALS — BP 114/73 | HR 80 | Ht 61.0 in | Wt 125.3 lb

## 2021-01-31 DIAGNOSIS — Z3041 Encounter for surveillance of contraceptive pills: Secondary | ICD-10-CM

## 2021-01-31 DIAGNOSIS — Z1159 Encounter for screening for other viral diseases: Secondary | ICD-10-CM

## 2021-01-31 DIAGNOSIS — Z01419 Encounter for gynecological examination (general) (routine) without abnormal findings: Secondary | ICD-10-CM

## 2021-01-31 DIAGNOSIS — Z114 Encounter for screening for human immunodeficiency virus [HIV]: Secondary | ICD-10-CM

## 2021-01-31 DIAGNOSIS — Z8659 Personal history of other mental and behavioral disorders: Secondary | ICD-10-CM | POA: Diagnosis not present

## 2021-01-31 MED ORDER — LO LOESTRIN FE 1 MG-10 MCG / 10 MCG PO TABS: 1.0000 | ORAL_TABLET | Freq: Every day | ORAL | 4 refills | Status: DC

## 2021-01-31 NOTE — Progress Notes (Signed)
ANNUAL PREVENTATIVE CARE GYN  ENCOUNTER NOTE  Subjective:       Selena Rogers is a 25 y.o. G23P1001 female here for a routine annual gynecologic exam.  Current complaints: 1. Needs OCP refill  Taking Zoloft daily, no questions or concerns.   Denies difficulty breathing or respiratory distress, chest pain, abdominal pain, excessive vaginal bleeding, dysuria, and leg pain or swelling.    Gynecologic History  Patient's last menstrual period was 01/13/2021. Period Duration (Days): 6-7 Period Pattern: (!) Irregular Menstrual Flow: Light Menstrual Control: Panty liner Menstrual Control Change Freq (Hours): 2-6 Dysmenorrhea: None  Contraception: OCP (estrogen/progesterone)   Last Pap: 02/2019. Results were: normal  Obstetric History  OB History  Gravida Para Term Preterm AB Living  1 1 1  0 0 1  SAB IAB Ectopic Multiple Live Births  0 0 0 0 1    # Outcome Date GA Lbr Len/2nd Weight Sex Delivery Anes PTL Lv  1 Term 09/13/19 [redacted]w[redacted]d  8 lb 5 oz (3.771 kg) F Vag-Vacuum EPI N LIV     Complications: Failure to Progress in Second Stage    Past Medical History:  Diagnosis Date  . Anxiety   . Asthma   . Depression   . Genital herpes   . Seasonal allergies     Past Surgical History:  Procedure Laterality Date  . TONSILLECTOMY    . TOOTH EXTRACTION      Current Outpatient Medications on File Prior to Visit  Medication Sig Dispense Refill  . fluticasone (FLONASE) 50 MCG/ACT nasal spray 1 spray by Each Nare route daily.    . LO LOESTRIN FE 1 MG-10 MCG / 10 MCG tablet TAKE 1 TABLET BY MOUTH AT BEDTIME FOR 28 DAYS. 28 tablet 0  . sertraline (ZOLOFT) 50 MG tablet TAKE 1 TABLET BY MOUTH EVERY DAY 90 tablet 2  . acetaminophen (TYLENOL) 325 MG tablet Take 2 tablets (650 mg total) by mouth every 4 (four) hours as needed (for pain scale < 4). (Patient not taking: No sig reported) 48 tablet 0  . beclomethasone (QVAR) 40 MCG/ACT inhaler Inhale 2 puffs into the lungs 2 (two)  times daily. (Patient not taking: Reported on 01/31/2021) 1 Inhaler 0  . coconut oil OIL Apply 1 application topically as needed. (Patient not taking: No sig reported) 120 mL 0  . escitalopram (LEXAPRO) 10 MG tablet Take 1 tablet (10 mg total) by mouth daily. (Patient not taking: Reported on 01/31/2021) 30 tablet 2  . ferrous sulfate 325 (65 FE) MG tablet Take 1 tablet (325 mg total) by mouth 2 (two) times daily with a meal. (Patient not taking: No sig reported) 60 tablet 3  . ibuprofen (ADVIL) 600 MG tablet Take 1 tablet (600 mg total) by mouth every 6 (six) hours. (Patient not taking: No sig reported) 30 tablet 0  . montelukast (SINGULAIR) 10 MG tablet Take 10 mg by mouth at bedtime. (Patient not taking: Reported on 01/31/2021)    . Prenatal Vit-Fe Fumarate-FA (PRENATAL MULTIVITAMIN) TABS tablet Take 1 tablet by mouth daily at 12 noon. (Patient not taking: Reported on 01/31/2021)    . PROAIR HFA 108 (90 Base) MCG/ACT inhaler INHALE 2 PUFFS BY MOUTH EVERY 4 HOURS AS NEEDED FOR COUGH OR WHEEZING (Patient not taking: Reported on 01/31/2021) 8.5 g 0  . senna-docusate (SENOKOT-S) 8.6-50 MG tablet Take 2 tablets by mouth daily. (Patient not taking: No sig reported) 30 tablet 0  . simethicone (MYLICON) 80 MG chewable tablet Chew 1 tablet (80  mg total) by mouth as needed for flatulence. (Patient not taking: No sig reported) 30 tablet 0  . witch hazel-glycerin (TUCKS) pad Apply 1 application topically as needed for hemorrhoids. (Patient not taking: No sig reported) 40 each 12   No current facility-administered medications on file prior to visit.    Allergies  Allergen Reactions  . Clindamycin/Lincomycin Rash  . Zithromax [Azithromycin] Rash    Social History   Socioeconomic History  . Marital status: Divorced    Spouse name: Not on file  . Number of children: Not on file  . Years of education: Not on file  . Highest education level: Not on file  Occupational History  . Not on file  Tobacco Use  .  Smoking status: Never Smoker  . Smokeless tobacco: Never Used  Vaping Use  . Vaping Use: Never used  Substance and Sexual Activity  . Alcohol use: No  . Drug use: No  . Sexual activity: Not Currently    Birth control/protection: None  Other Topics Concern  . Not on file  Social History Narrative  . Not on file   Social Determinants of Health   Financial Resource Strain: Not on file  Food Insecurity: Not on file  Transportation Needs: Not on file  Physical Activity: Not on file  Stress: Not on file  Social Connections: Not on file  Intimate Partner Violence: Not on file    Family History  Problem Relation Age of Onset  . Seizures Brother   . Healthy Mother   . Healthy Father   . Breast cancer Neg Hx   . Ovarian cancer Neg Hx   . Colon cancer Neg Hx     The following portions of the patient's history were reviewed and updated as appropriate: allergies, current medications, past family history, past medical history, past social history, past surgical history and problem list.  Review of Systems  ROS negative except as noted above. Information obtained from patient.    Objective:   BP 114/73   Pulse 80   Ht 5\' 1"  (1.549 m)   Wt 125 lb 4.8 oz (56.8 kg)   LMP 01/13/2021   BMI 23.68 kg/m    CONSTITUTIONAL: Well-developed, well-nourished female in no acute distress.   PSYCHIATRIC: Normal mood and affect. Normal behavior. Normal judgment and thought content.  NEUROLGIC: Alert and oriented to person, place, and time. Normal muscle tone coordination. No cranial nerve deficit noted.  HENT:  Normocephalic, atraumatic.  EYES: Conjunctivae and EOM are normal.   NECK: Normal range of motion, supple, no masses.  Normal thyroid.   SKIN: Skin is warm and dry. No rash noted. Not diaphoretic. No erythema. No pallor.  CARDIOVASCULAR: Normal heart rate noted, regular rhythm, no murmur.  RESPIRATORY: Clear to auscultation bilaterally.  Effort and breath sounds normal, no  problems with  respiration noted.  BREASTS: Symmetric in size. No masses, skin changes, nipple drainage, or lymphadenopathy.  ABDOMEN: Soft, normal bowel sounds, no distention noted.  No tenderness, rebound or guarding.   PELVIC:  External Genitalia: Normal  Vagina: Normal  Cervix: Normal  Uterus: Normal  Adnexa: Normal   MUSCULOSKELETAL: Normal range of motion. No tenderness.  No cyanosis, clubbing, or edema.  2+ distal pulses.  LYMPHATIC: No Axillary, Supraclavicular, or Inguinal Adenopathy.  Depression screen [facility] 2/9 01/31/2021 01/29/2020 10/29/2019 10/07/2019 09/18/2019  Decreased Interest 0 0 2 0 0  Down, Depressed, Hopeless 0 0 1 0 0  PHQ - 2 Score 0 0 3 0 0  Altered sleeping 0 0 0 0 0  Tired, decreased energy 0 1 1 0 0  Change in appetite 0 0 1 0 0  Feeling bad or failure about yourself  0 0 1 0 0  Trouble concentrating 0 0 0 0 0  Moving slowly or fidgety/restless 0 0 0 0 0  Suicidal thoughts 0 0 0 0 0  PHQ-9 Score 0 1 6 0 0  Difficult doing work/chores - Not difficult at all Somewhat difficult Not difficult at all -   GAD 7 : Generalized Anxiety Score 01/31/2021 01/29/2020 09/18/2019 06/16/2019  Nervous, Anxious, on Edge 0 0 0 0  Control/stop worrying 0 0 2 1  Worry too much - different things 0 0 0 1  Trouble relaxing 0 0 2 1  Restless 0 0 0 0  Easily annoyed or irritable 0 0 2 1  Afraid - awful might happen 0 0 0 0  Total GAD 7 Score 0 0 6 4  Anxiety Difficulty - - Not difficult at all Not difficult at all    Assessment:   Annual gynecologic examination 25 y.o.   Contraception: OCP (estrogen/progesterone)   Normal BMI   Problem List Items Addressed This Visit      Other   History of depression    Other Visit Diagnoses    Well woman exam    -  Primary   Surveillance for birth control, oral contraceptives          Plan:   Pap: Not needed  Labs: Declined  Routine preventative health maintenance measures emphasized: Exercise/Diet/Weight control,  Tobacco Warnings, Alcohol/Substance use risks and Stress Management; see AVS  Rx OCP, see orders  Reviewed red flag symptoms and when to call  Return to Clinic - 1 Year for [facility] or sooner if needed   Serafina Royals, CNM  [facility], [facility] 01/31/21 9:59 AM

## 2021-01-31 NOTE — Progress Notes (Signed)
Pt present for annual exam. Pt stated that she was doing well.  

## 2021-01-31 NOTE — Patient Instructions (Signed)

## 2021-03-24 ENCOUNTER — Other Ambulatory Visit: Payer: Self-pay | Admitting: Certified Nurse Midwife

## 2021-03-24 DIAGNOSIS — Z8659 Personal history of other mental and behavioral disorders: Secondary | ICD-10-CM

## 2021-04-28 ENCOUNTER — Encounter: Payer: Medicaid Other | Admitting: Certified Nurse Midwife

## 2021-04-29 ENCOUNTER — Other Ambulatory Visit (HOSPITAL_COMMUNITY)
Admission: RE | Admit: 2021-04-29 | Discharge: 2021-04-29 | Disposition: A | Discharge status: Home / Self Care | Payer: Medicaid Other | Source: Ambulatory Visit | Attending: Certified Nurse Midwife | Admitting: Certified Nurse Midwife

## 2021-04-29 ENCOUNTER — Encounter: Payer: Self-pay | Admitting: Certified Nurse Midwife

## 2021-04-29 ENCOUNTER — Ambulatory Visit (INDEPENDENT_AMBULATORY_CARE_PROVIDER_SITE_OTHER): Payer: Medicaid Other | Admitting: Certified Nurse Midwife

## 2021-04-29 ENCOUNTER — Other Ambulatory Visit: Payer: Self-pay

## 2021-04-29 VITALS — BP 108/70 | HR 82 | Resp 16 | Ht 61.0 in | Wt 132.0 lb

## 2021-04-29 DIAGNOSIS — Z8616 Personal history of COVID-19: Secondary | ICD-10-CM | POA: Diagnosis not present

## 2021-04-29 DIAGNOSIS — Z8619 Personal history of other infectious and parasitic diseases: Secondary | ICD-10-CM | POA: Diagnosis not present

## 2021-04-29 DIAGNOSIS — N898 Other specified noninflammatory disorders of vagina: Secondary | ICD-10-CM | POA: Diagnosis present

## 2021-04-29 DIAGNOSIS — Z113 Encounter for screening for infections with a predominantly sexual mode of transmission: Secondary | ICD-10-CM

## 2021-04-29 MED ORDER — VALACYCLOVIR HCL 500 MG PO TABS: 500.0000 mg | ORAL_TABLET | Freq: Every day | ORAL | 12 refills | Status: DC

## 2021-04-29 NOTE — Patient Instructions (Signed)
Valacyclovir Tablets What is this medication? VALACYCLOVIR (val ay SYE kloe veer) helps manage infections, such as cold sores, genital herpes, shingles, and chickenpox, caused by viruses. It will nottreat colds, the flu, or infections caused by bacteria. This medicine may be used for other purposes; ask your health care provider orpharmacist if you have questions. COMMON BRAND NAME(S): Valtrex What should I tell my care team before I take this medication? They need to know if you have any of these conditions: Acquired immunodeficiency syndrome (AIDS) Any other condition that may weaken the immune system Bone marrow or kidney transplant Kidney disease An unusual or allergic reaction to valacyclovir, acyclovir, ganciclovir, valganciclovir, other medications, foods, dyes, or preservatives Pregnant or trying to get pregnant Breast-feeding How should I use this medication? Take this medication by mouth with a glass of water. Follow the directions on the prescription label. You can take this medication with or without food. Take your doses at regular intervals. Do not take your medication more often than directed. Finish the full course prescribed by your care team even if you think your condition is better. Do not stop taking except on the advice of your careteam. Talk to your care team about the use of this medication in children. While this medication may be prescribed for children as young as 2 years for selectedconditions, precautions do apply. Overdosage: If you think you have taken too much of this medicine contact apoison control center or emergency room at once. NOTE: This medicine is only for you. Do not share this medicine with others. What if I miss a dose? If you miss a dose, take it as soon as you can. If it is almost time for yournext dose, take only that dose. Do not take double or extra doses. What may interact with this medication? Do not take this medication with any of the  following: Cidofovir This medication may also interact with the following: Adefovir Amphotericin B Certain antibiotics like amikacin, gentamicin, tobramycin, vancomycin Cimetidine Cisplatin Colistin Cyclosporine [place] Lithium Methotrexate Probenecid Tacrolimus This list may not describe all possible interactions. Give your health care provider a list of all the medicines, herbs, non-prescription drugs, or dietary supplements you use. Also tell them if you smoke, drink alcohol, or use illegaldrugs. Some items may interact with your medicine. What should I watch for while using this medication? Tell your care team if your symptoms do not start to get better after 1 week. This medication works best when taken early in the course of an infection, within the first 72 hours. Begin treatment as soon as possible after the firstsigns of infection like tingling, itching, or pain in the affected area. It is possible that genital herpes may still be spread even when you are not having symptoms. Always use safer sex practices like condoms made of latex orpolyurethane whenever you have sexual contact. You should stay well hydrated while taking this medication. Drink plenty offluids. What side effects may I notice from receiving this medication? Side effects that you should report to your care team as soon as possible: Allergic reactions-skin rash, itching, hives, swelling of the face, lips, tongue, or throat Confusion Hallucinations Kidney injury-decrease in the amount of urine, swelling of the ankles, hands, or feet Seizures Side effects that usually do not require medical attention (report to your careteam if they continue or are bothersome): Headache Nausea Stomach pain This list may not describe all possible side effects. Call your doctor for medical advice about side effects. You may report  side effects to FDA at1-800-FDA-1088. Where should I keep my medication? Keep out of the reach of  children. Store at room temperature between 15 and 25 degrees C (59 and 77 degrees F). Keep container tightly closed. Throw away any unused medication after theexpiration date. NOTE: This sheet is a summary. It may not cover all possible information. If you have questions about this medicine, talk to your doctor, pharmacist, orhealth care provider.  2022 Elsevier/Gold Standard (2020-12-02 09:43:56)

## 2021-05-02 NOTE — Progress Notes (Signed)
GYN ENCOUNTER NOTE  Subjective:       Selena Rogers is a 25 y.o. G9P1001 female is here for gynecologic evaluation of the following issues:  Vaginal irritation, decreased appetite, chills and headache for the last seven (7) days; questions herpes outbreak during COVID recovery  Denies difficulty breathing or respiratory distress, chest pain, abdominal pain, excessive vaginal bleeding, dysuria, and leg pain or swelling.    Gynecologic History  Patient's last menstrual period was 03/25/2021.  Contraception: OCP (estrogen/progesterone)  Last Pap: 02/2019. Results were: normal  Obstetric History  OB History  Gravida Para Term Preterm AB Living  1 1 1  0 0 1  SAB IAB Ectopic Multiple Live Births  0 0 0 0 1    # Outcome Date GA Lbr Len/2nd Weight Sex Delivery Anes PTL Lv  1 Term 09/13/19 [redacted]w[redacted]d  8 lb 5 oz (3.771 kg) F Vag-Vacuum EPI N LIV     Complications: Failure to Progress in Second Stage    Past Medical History:  Diagnosis Date   Anxiety    Asthma    Depression    Genital herpes    Seasonal allergies     Past Surgical History:  Procedure Laterality Date   TONSILLECTOMY     TOOTH EXTRACTION      Current Outpatient Medications on File Prior to Visit  Medication Sig Dispense Refill   fluticasone (FLONASE) 50 MCG/ACT nasal spray 1 spray by Each Nare route daily.     Norethindrone-Ethinyl Estradiol-Fe Biphas (LO LOESTRIN FE) 1 MG-10 MCG / 10 MCG tablet Take 1 tablet by mouth at bedtime. 84 tablet 4   sertraline (ZOLOFT) 50 MG tablet TAKE 1 TABLET BY MOUTH EVERY DAY 90 tablet 2   No current facility-administered medications on file prior to visit.    Allergies  Allergen Reactions   Clindamycin/Lincomycin Rash   Zithromax [Azithromycin] Rash    Social History   Socioeconomic History   Marital status: Divorced    Spouse name: Not on file   Number of children: Not on file   Years of education: Not on file   Highest education level: Not on file   Occupational History   Not on file  Tobacco Use   Smoking status: Never   Smokeless tobacco: Never  Vaping Use   Vaping Use: Never used  Substance and Sexual Activity   Alcohol use: No   Drug use: No   Sexual activity: Not Currently    Birth control/protection: None  Other Topics Concern   Not on file  Social History Narrative   Not on file   Social Determinants of Health   Financial Resource Strain: Not on file  Food Insecurity: Not on file  Transportation Needs: Not on file  Physical Activity: Not on file  Stress: Not on file  Social Connections: Not on file  Intimate Partner Violence: Not on file    Family History  Problem Relation Age of Onset   Seizures Brother    Healthy Mother    Healthy Father    Breast cancer Neg Hx    Ovarian cancer Neg Hx    Colon cancer Neg Hx     The following portions of the patient's history were reviewed and updated as appropriate: allergies, current medications, past family history, past medical history, past social history, past surgical history and problem list.  Review of Systems  ROS negative except as noted above. Information obtained from patient.   Objective:   BP 108/70  Pulse 82   Resp 16   Ht 5\' 1"  (1.549 m)   Wt 132 lb (59.9 kg)   LMP 03/25/2021   BMI 24.94 kg/m   CONSTITUTIONAL: Well-developed, well-nourished female in no acute distress.   PELVIC:  External Genitalia: Normal  Vagina: Normal, swab collected   MUSCULOSKELETAL: Normal range of motion. No tenderness.  No cyanosis, clubbing, or edema.  Assessment:   1. Vaginal irritation  - Cervicovaginal ancillary only  2. Vaginal discharge  - Cervicovaginal ancillary only  3. History of herpes genitalis   4. History of COVID-19   5. Routine screening for STI (sexually transmitted infection)  - Cervicovaginal ancillary only     Plan:   Vaginal swab collected, see orders.   Rx Valtrex, see chart.   Reviewed red flag symptoms and when  to call.   RTC as needed.    03/27/2021, CNM [facility], [facility]

## 2021-05-03 LAB — CERVICOVAGINAL ANCILLARY ONLY
Bacterial Vaginitis (gardnerella): POSITIVE — AB
Candida Glabrata: NEGATIVE
Candida Vaginitis: NEGATIVE
Chlamydia: NEGATIVE
Comment: NEGATIVE
Comment: NEGATIVE
Comment: NEGATIVE
Comment: NEGATIVE
Comment: NEGATIVE
Comment: NORMAL
Neisseria Gonorrhea: NEGATIVE
Trichomonas: NEGATIVE

## 2021-05-05 ENCOUNTER — Other Ambulatory Visit: Payer: Self-pay | Admitting: Certified Nurse Midwife

## 2021-05-05 DIAGNOSIS — B9689 Other specified bacterial agents as the cause of diseases classified elsewhere: Secondary | ICD-10-CM

## 2021-05-05 MED ORDER — METRONIDAZOLE 500 MG PO TABS: 500.0000 mg | ORAL_TABLET | Freq: Two times a day (BID) | ORAL | 0 refills | Status: AC

## 2021-05-05 NOTE — Telephone Encounter (Signed)
Sent. Thanks, JML

## 2021-05-05 NOTE — Progress Notes (Signed)
Rx Flagyl, see orders.    Selena Rogers, CNM [facility], [facility] 05/05/21 9:48 AM

## 2022-02-02 ENCOUNTER — Encounter: Payer: Medicaid Other | Admitting: Certified Nurse Midwife

## 2022-08-22 ENCOUNTER — Encounter: Payer: Self-pay | Admitting: Certified Nurse Midwife

## 2024-03-03 ENCOUNTER — Encounter: Payer: Self-pay | Admitting: Family Medicine

## 2024-03-03 ENCOUNTER — Ambulatory Visit (INDEPENDENT_AMBULATORY_CARE_PROVIDER_SITE_OTHER): Admitting: Family Medicine

## 2024-03-03 VITALS — BP 104/65 | HR 83 | Ht 61.0 in | Wt 150.0 lb

## 2024-03-03 DIAGNOSIS — F419 Anxiety disorder, unspecified: Secondary | ICD-10-CM

## 2024-03-03 DIAGNOSIS — F330 Major depressive disorder, recurrent, mild: Secondary | ICD-10-CM

## 2024-03-03 DIAGNOSIS — Z7689 Persons encountering health services in other specified circumstances: Secondary | ICD-10-CM

## 2024-03-03 NOTE — Progress Notes (Signed)
 New Patient Office Visit  Subjective   Patient ID: Selena Rogers, female    DOB: 1996/08/02  Age: 28 y.o. MRN: [id number]  CC:  Chief Complaint  Patient presents with   New Patient (Initial Visit)    Patient is here today to get established with the practice. Denies any main concerns for today's visit.    HPI Selena Rogers Broadus Canes is a 28 year old female who presents to establish with [facility] at [facility]   CC: Patient here to establish care  Last PCP: Stephenie Einstein  Specialists: OBGYN   PMHx: anxiety & depression  Zoloft  50mg  daily- has been on this medication for the past 4 years since her daughter was born  She reports this medication used to help her fall asleep at night but now she is feeling "more wired." Difficulty falling asleep, reports only sleeping 4-5 hours per night. Sometimes she will be awake until 4am. She reports that she took trazodone in childhood and this really knocked her out.  She was on Lexapro  in the past and it gave her a "false sense of reality." She felt like she "didn't care/emotionless."      03/03/2024    2:19 PM 01/31/2021   10:05 AM 01/29/2020   10:57 AM 09/18/2019    2:08 PM  GAD 7 : Generalized Anxiety Score  Nervous, Anxious, on Edge 0 0 0 0  Control/stop worrying 2 0 0 2  Worry too much - different things 2 0 0 0  Trouble relaxing 3 0 0 2  Restless 0 0 0 0  Easily annoyed or irritable 0 0 0 2  Afraid - awful might happen 0 0 0 0  Total GAD 7 Score 7 0 0 6  Anxiety Difficulty Somewhat difficult   Not difficult at all       03/03/2024    2:19 PM 04/29/2021    2:09 PM 01/31/2021   10:05 AM  PHQ9 SCORE ONLY  PHQ-9 Total Score 9 0 0     Outpatient Encounter Medications as of 03/03/2024  Medication Sig   albuterol  (VENTOLIN  HFA) 108 (90 Base) MCG/ACT inhaler Inhale 1-2 puffs into the lungs every 4 (four) hours as needed.   fluticasone (FLONASE) 50 MCG/ACT nasal spray 1 spray by Each Nare route daily.    sertraline  (ZOLOFT ) 50 MG tablet TAKE 1 TABLET BY MOUTH EVERY DAY   [DISCONTINUED] Norethindrone-Ethinyl Estradiol-Fe Biphas (LO LOESTRIN FE ) 1 MG-10 MCG / 10 MCG tablet Take 1 tablet by mouth at bedtime.   [DISCONTINUED] valACYclovir  (VALTREX ) 500 MG tablet Take 1 tablet (500 mg total) by mouth daily. Can increase to twice a day for 5 days in the event of a recurrence   No facility-administered encounter medications on file as of 03/03/2024.    Patient Active Problem List   Diagnosis Date Noted   Type 3b perineal laceration during delivery 09/14/2019   Genital herpes 05/22/2019   Asthma 05/22/2019   History of depression 05/22/2019   Past Medical History:  Diagnosis Date   Anxiety    Asthma    Depression    Genital herpes    Seasonal allergies    Past Surgical History:  Procedure Laterality Date   SKIN GRAFT Left 1999   lower leg   TONSILLECTOMY     TOOTH EXTRACTION     Family History  Problem Relation Age of Onset   Stroke Mother    Healthy Father    Seizures Brother  Breast cancer Neg Hx    Ovarian cancer Neg Hx    Colon cancer Neg Hx    Social History   Socioeconomic History   Marital status: Divorced    Spouse name: Not on file   Number of children: Not on file   Years of education: Not on file   Highest education level: Not on file  Occupational History   Not on file  Tobacco Use   Smoking status: Never   Smokeless tobacco: Never  Vaping Use   Vaping status: Never Used  Substance and Sexual Activity   Alcohol use: No   Drug use: No   Sexual activity: Not Currently    Birth control/protection: None  Other Topics Concern   Not on file  Social History Narrative   Not on file   Social Drivers of Health   Financial Resource Strain: Not on file  Food Insecurity: Not on file  Transportation Needs: Not on file  Physical Activity: Not on file  Stress: Not on file  Social Connections: Not on file  Intimate Partner Violence: Not on file    Outpatient Medications Prior to Visit  Medication Sig Dispense Refill   albuterol  (VENTOLIN  HFA) 108 (90 Base) MCG/ACT inhaler Inhale 1-2 puffs into the lungs every 4 (four) hours as needed.     fluticasone (FLONASE) 50 MCG/ACT nasal spray 1 spray by Each Nare route daily.     sertraline  (ZOLOFT ) 50 MG tablet TAKE 1 TABLET BY MOUTH EVERY DAY 90 tablet 2   Norethindrone-Ethinyl Estradiol-Fe Biphas (LO LOESTRIN FE ) 1 MG-10 MCG / 10 MCG tablet Take 1 tablet by mouth at bedtime. 84 tablet 4   valACYclovir  (VALTREX ) 500 MG tablet Take 1 tablet (500 mg total) by mouth daily. Can increase to twice a day for 5 days in the event of a recurrence 30 tablet 12   No facility-administered medications prior to visit.   Allergies  Allergen Reactions   Clindamycin/Lincomycin Rash   Zithromax [Azithromycin] Rash    ROS: see HPI       Objective   Today's Vitals   03/03/24 1412  BP: 104/65  Pulse: 83  SpO2: 97%  Weight: 150 lb (68 kg)  Height: 5\' 1"  (1.549 m)    GENERAL: Well-appearing, in NAD. Well nourished.  SKIN: Pink, warm and dry. No rash, lesion, ulceration, or ecchymoses.  Head: Normocephalic. NECK: Trachea midline. Full ROM w/o pain or tenderness. No lymphadenopathy.  EARS: Tympanic membranes are intact, translucent without bulging and without drainage. Appropriate landmarks visualized.  EYES: Conjunctiva clear without exudates. EOMI, PERRL, no drainage present.  NOSE: Septum midline w/o deformity. Nares patent, mucosa pink and non-inflamed w/o drainage. No sinus tenderness.  THROAT: Uvula midline. Oropharynx clear. Tonsils non-inflamed without exudate. Mucous membranes pink and moist.  RESPIRATORY: Chest wall symmetrical. Respirations even and non-labored. Breath sounds clear to auscultation bilaterally.  CARDIAC: S1, S2 present, regular rate and rhythm without murmur or gallops. Peripheral pulses 2+ bilaterally.  MSK: Muscle tone and strength appropriate for age. Joints w/o  tenderness, redness, or swelling.  EXTREMITIES: Without clubbing, cyanosis, or edema.  NEUROLOGIC: No motor or sensory deficits. Steady, even gait. C2-C12 intact.  PSYCH/MENTAL STATUS: Alert, oriented x 3. Cooperative, appropriate mood and affect.     Assessment & Plan:   1. Encounter to establish care (Primary) Patient is a 50- year-old female who presents today to establish care with primary care at [facility]. Reviewed the past medical history, family history, social history, surgical history, medications  and allergies today- updates made as indicated. Patient does not have concerns today.   2. Anxiety GAD7 completed with score of 7. Denies issues with panic attacks, shortness of breath, difficulty breathing, palpitations, hyperventilation, and dizziness. Discussed benefits of cognitive behavioral therapy (CBT) and continuing with current medication regimen. Discussed OTC and prescription medications to help patient sleep at night. Discussed taking Zoloft  during the day to see if this helps prevent insomnia. Does not need refill at this time.   3. Mild episode of recurrent major depressive disorder (HCC) PHQ9 completed with score of 9. Denies active or passive suicidal ideations. Continue with Zoloft  at this time.    Return in about 4 weeks (around 03/31/2024) for Physical with fasting labs.   Wilhelmena Hanson, FNP

## 2024-03-03 NOTE — Patient Instructions (Addendum)
 *Please come back 1 week prior to physical to get fasting labs done. Thanks!    MyChart:  For all urgent or time sensitive needs we ask that you please call the office to avoid delays. Our number is [phone]. MyChart is not constantly monitored and due to the large volume of messages a day, replies may take up to 72 business hours.   MyChart Policy: MyChart allows for you to see your visit notes, after visit summary, provider recommendations, lab and tests results, make an appointment, request refills, and contact your provider or the office for non-urgent questions or concerns. Providers are seeing patients during normal business hours and do not have built in time to review MyChart messages.  We ask that you allow a minimum of 3 business days for responses to [employer]. For this reason, please do not send urgent requests through MyChart. Please call the office at [phone]. New and ongoing conditions may require a visit. We have virtual and in person visit available for your convenience.  Complex MyChart concerns may require a visit. Your provider may request you schedule a virtual or in person visit to ensure we are providing the best care possible. MyChart messages sent after 11:00 AM on Friday will not be received by the provider until Monday morning.    Lab and Test Results: You will receive your lab and test results on MyChart as soon as they are completed and results have been sent by the lab or testing facility. Due to this service, you will receive your results BEFORE your provider.  I review lab and tests results each morning prior to seeing patients. Some results require collaboration with other providers to ensure you are receiving the most appropriate care. For this reason, we ask that you please allow a minimum of 3-5 business days from the time the ALL results have been received for your provider to receive and review lab and test results and contact you about these.   Most lab and test result comments from the provider will be sent through MyChart. Your provider may recommend changes to the plan of care, follow-up visits, repeat testing, ask questions, or request an office visit to discuss these results. You may reply directly to this message or call the office at [phone] to provide information for the provider or set up an appointment. In some instances, you will be called with test results and recommendations. Please let us  know if this is preferred and we will make note of this in your chart to provide this for you.    If you have not heard a response to your lab or test results in 5 business days from all results returning to MyChart, please call the office to let us  know. We ask that you please avoid calling prior to this time unless there is an emergent concern. Due to high call volumes, this can delay the resulting process.   After Hours: For all non-emergency after hours needs, please call the office at [phone] and select the option to reach the on-call provider service. On-call services are shared between multiple [facility] offices and therefore it will not be possible to speak directly with your provider. On-call providers may provide medical advice and recommendations, but are unable to provide refills for maintenance medications.  For all emergency or urgent medical needs after normal business hours, we recommend that you seek care at the closest Urgent Care or Emergency Department to ensure appropriate treatment in a timely manner.  [facility] [place] has a 24 hour emergency room located on the ground floor for your convenience.    Urgent Concerns During the Business Day Providers are seeing patients from 8AM to 5PM, Monday through Thursday, and 8AM to 12PM on Friday with a busy schedule and are most often not able to respond to non-urgent calls until the end of the day or the next business day. If you should have URGENT  concerns during the day, please call and speak to the nurse or schedule a same day appointment so that we can address your concern without delay.    Thank you, again, for choosing me as your health care partner. I appreciate your trust and look forward to learning more about you.    Wilhelmena Hanson, FNP-C

## 2024-03-27 ENCOUNTER — Ambulatory Visit

## 2024-03-27 ENCOUNTER — Other Ambulatory Visit: Payer: Self-pay | Admitting: Family Medicine

## 2024-03-27 DIAGNOSIS — Z113 Encounter for screening for infections with a predominantly sexual mode of transmission: Secondary | ICD-10-CM | POA: Diagnosis not present

## 2024-03-27 DIAGNOSIS — Z7689 Persons encountering health services in other specified circumstances: Secondary | ICD-10-CM | POA: Diagnosis not present

## 2024-03-27 DIAGNOSIS — Z136 Encounter for screening for cardiovascular disorders: Secondary | ICD-10-CM | POA: Diagnosis not present

## 2024-03-27 DIAGNOSIS — Z1329 Encounter for screening for other suspected endocrine disorder: Secondary | ICD-10-CM | POA: Diagnosis not present

## 2024-03-28 ENCOUNTER — Ambulatory Visit: Payer: Self-pay | Admitting: Family Medicine

## 2024-03-28 LAB — LIPID PANEL
Chol/HDL Ratio: 3.6 ratio (ref 0.0–4.4)
Cholesterol, Total: 187 mg/dL (ref 100–199)
HDL: 52 mg/dL (ref >39)
LDL Chol Calc (NIH): 124 mg/dL — ABNORMAL HIGH (ref 0–99)
Triglycerides: 57 mg/dL (ref 0–149)
VLDL Cholesterol Cal: 11 mg/dL (ref 5–40)

## 2024-03-28 LAB — COMPREHENSIVE METABOLIC PANEL WITH GFR
ALT: 12 IU/L (ref 0–32)
AST: 20 IU/L (ref 0–40)
Albumin: 4.2 g/dL (ref 4.0–5.0)
Alkaline Phosphatase: 84 IU/L (ref 44–121)
BUN/Creatinine Ratio: 10 (ref 9–23)
BUN: 8 mg/dL (ref 6–20)
Bilirubin Total: 0.4 mg/dL (ref 0.0–1.2)
CO2: 22 mmol/L (ref 20–29)
Calcium: 9.1 mg/dL (ref 8.7–10.2)
Chloride: 105 mmol/L (ref 96–106)
Creatinine, Ser: 0.77 mg/dL (ref 0.57–1.00)
Globulin, Total: 2.7 g/dL (ref 1.5–4.5)
Glucose: 80 mg/dL (ref 70–99)
Potassium: 3.9 mmol/L (ref 3.5–5.2)
Sodium: 140 mmol/L (ref 134–144)
Total Protein: 6.9 g/dL (ref 6.0–8.5)
eGFR: 109 mL/min/{1.73_m2} (ref >59)

## 2024-03-28 LAB — HEPATITIS C ANTIBODY: Hep C Virus Ab: NONREACTIVE

## 2024-03-28 LAB — CBC WITH DIFFERENTIAL/PLATELET
Basophils Absolute: 0 10*3/uL (ref 0.0–0.2)
Basos: 1 %
EOS (ABSOLUTE): 0.1 10*3/uL (ref 0.0–0.4)
Eos: 2 %
Hematocrit: 41.8 % (ref 34.0–46.6)
Hemoglobin: 13.9 g/dL (ref 11.1–15.9)
Immature Grans (Abs): 0 10*3/uL (ref 0.0–0.1)
Immature Granulocytes: 0 %
Lymphocytes Absolute: 2 10*3/uL (ref 0.7–3.1)
Lymphs: 30 %
MCH: 29.8 pg (ref 26.6–33.0)
MCHC: 33.3 g/dL (ref 31.5–35.7)
MCV: 90 fL (ref 79–97)
Monocytes Absolute: 0.6 10*3/uL (ref 0.1–0.9)
Monocytes: 10 %
Neutrophils Absolute: 3.8 10*3/uL (ref 1.4–7.0)
Neutrophils: 57 %
Platelets: 298 10*3/uL (ref 150–450)
RBC: 4.67 x10E6/uL (ref 3.77–5.28)
RDW: 13.7 % (ref 11.7–15.4)
WBC: 6.5 10*3/uL (ref 3.4–10.8)

## 2024-03-28 LAB — RPR: RPR Ser Ql: NONREACTIVE

## 2024-03-28 LAB — HEMOGLOBIN A1C
Est. average glucose Bld gHb Est-mCnc: 111 mg/dL
Hgb A1c MFr Bld: 5.5 % (ref 4.8–5.6)

## 2024-03-28 LAB — TSH: TSH: 1.28 u[IU]/mL (ref 0.450–4.500)

## 2024-03-28 LAB — HIV ANTIBODY (ROUTINE TESTING W REFLEX): HIV Screen 4th Generation wRfx: NONREACTIVE

## 2024-03-29 LAB — HSV 1 AND 2 AB, IGG
HSV 1 Glycoprotein G Ab, IgG: REACTIVE — AB
HSV 2 IgG, Type Spec: NONREACTIVE

## 2024-04-01 ENCOUNTER — Ambulatory Visit (INDEPENDENT_AMBULATORY_CARE_PROVIDER_SITE_OTHER): Admitting: Family Medicine

## 2024-04-01 ENCOUNTER — Encounter: Payer: Self-pay | Admitting: Family Medicine

## 2024-04-01 VITALS — BP 109/70 | HR 72 | Temp 97.8°F | Resp 18 | Ht 61.0 in | Wt 150.0 lb

## 2024-04-01 DIAGNOSIS — Z83438 Family history of other disorder of lipoprotein metabolism and other lipidemia: Secondary | ICD-10-CM

## 2024-04-01 DIAGNOSIS — Z0000 Encounter for general adult medical examination without abnormal findings: Secondary | ICD-10-CM | POA: Diagnosis not present

## 2024-04-01 DIAGNOSIS — E7800 Pure hypercholesterolemia, unspecified: Secondary | ICD-10-CM | POA: Diagnosis not present

## 2024-04-01 NOTE — Patient Instructions (Addendum)
 Health Maintenance Recommendations Screening Testing Mammogram Every 1 -2 years based on history and risk factors Starting at age 26 Pap Smear Ages 21-39 every 3 years Ages 58-65 every 5 years with HPV testing More frequent testing may be required based on results and history Colon Cancer Screening Every 1-10 years based on test performed, risk factors, and history Starting at age 34 Bone Density Screening Every 2-10 years based on history Starting at age 55 for women Recommendations for men differ based on medication usage, history, and risk factors AAA Screening One time ultrasound Men 19-75 years old who have every smoked Lung Cancer Screening Low Dose Lung CT every 12 months Age 70-80 years with a 30 pack-year smoking history who still smoke or who have quit within the last 15 years   Screening Labs Routine  Labs: Complete Blood Count (CBC), Complete Metabolic Panel (CMP), Cholesterol (Lipid Panel) Every 6-12 months based on history and medications May be recommended more frequently based on current conditions or previous results Hemoglobin A1c Lab Every 3-12 months based on history and previous results Starting at age 65 or earlier with diagnosis of diabetes, high cholesterol, BMI >26, and/or risk factors Frequent monitoring for patients with diabetes to ensure blood sugar control Thyroid Panel (TSH w/ T3 & T4) Every 6 months based on history, symptoms, and risk factors May be repeated more often if on medication HIV One time testing for all patients 8 and older May be repeated more frequently for patients with increased risk factors or exposure Hepatitis C One time testing for all patients 60 and older May be repeated more frequently for patients with increased risk factors or exposure Gonorrhea, Chlamydia Every 12 months for all sexually active persons 13-24 years Additional monitoring may be recommended for those who are considered high risk or who have  symptoms PSA Men 17-24 years old with risk factors Additional screening may be recommended from age 68-69 based on risk factors, symptoms, and history   Vaccine Recommendations Tetanus Booster All adults every 10 years Flu Vaccine All patients 6 months and older every year COVID Vaccine All patients 12 years and older Initial dosing with booster May recommend additional booster based on age and health history HPV Vaccine 2 doses all patients age 51-26 Dosing may be considered for patients over 26 Shingles Vaccine (Shingrix) 2 doses all adults 55 years and older Pneumonia (Pneumovax 23) All adults 65 years and older May recommend earlier dosing based on health history Pneumonia (Prevnar 54) All adults 65 years and older Dosed 1 year after Pneumovax 23   Additional Screening, Testing, and Vaccinations may be recommended on an individualized basis based on family history, health history, risk factors, and/or exposure.  __________________________________________________________   Diet Recommendations for All Patients   I recommend that all patients maintain a diet low in saturated fats, carbohydrates, and cholesterol. While this can be challenging at first, it is not impossible and small changes can make big differences.  Things to try: Decreasing the amount of soda, sweet tea, and/or juice to one or less per day and replace with water While water is always the first choice, if you do not like water you may consider adding a water additive without sugar to improve the taste other sugar free drinks Replace potatoes with a brightly colored vegetable at dinner Use healthy oils, such as canola oil or olive oil, instead of butter or hard margarine Limit your bread intake to two pieces or less a day Replace regular pasta with  low carb pasta options Bake, broil, or grill foods instead of frying Monitor portion sizes  Eat smaller, more frequent meals throughout the day instead of large  meals   An important thing to remember is, if you love foods that are not great for your health, you don't have to give them up completely. Instead, allow these foods to be a reward when you have done well. Allowing yourself to still have special treats every once in a while is a nice way to tell yourself thank you for working hard to keep yourself healthy.    Also remember that every day is a new day. If you have a bad day and "fall off the wagon", you can still climb right back up and keep moving along on your journey!   We have resources available to help you!  Some websites that may be helpful include: www.http://www.wall-moore.info/        Www.VeryWellFit.com _____________________________________________________________   Activity Recommendations for All Patients   I recommend that all adults get at least 20 minutes of moderate physical activity that elevates your heart rate at least 5 days out of the week.  Some examples include: Walking or jogging at a pace that allows you to carry on a conversation Cycling (stationary bike or outdoors) Water aerobics Yoga Weight lifting Dancing If physical limitations prevent you from putting stress on your joints, exercise in a pool or seated in a chair are excellent options.   Do determine your MAXIMUM heart rate for activity: YOUR AGE - 220 = MAX HeartRate    Remember! Do not push yourself too hard.  Start slowly and build up your pace, speed, weight, time in exercise, etc.  Allow your body to rest between exercise and get good sleep. You will need more water than normal when you are exerting yourself. Do not wait until you are thirsty to drink. Drink with a purpose of getting in at least 8, 8 ounce glasses of water a day plus more depending on how much you exercise and sweat.     If you begin to develop dizziness, chest pain, abdominal pain, jaw pain, shortness of breath, headache, vision changes, lightheadedness, or other concerning symptoms, stop the  activity and allow your body to rest. If your symptoms are severe, seek emergency evaluation immediately. If your symptoms are concerning, but not severe, please let us  know so that we can recommend further evaluation.     Age 72-49 years with certain underlying medical conditions or other risk factors** who have: Not previously received a PCV13, PCV15, PCV20, or PCV21 or whose previous vaccination history is unknown: 1 dose PCV15 or 1 dose PCV20 or 1 dose PCV21. If PCV15 is used, administer 1 dose PPSV23 at least 1 year after the PCV15 dose (may use minimum interval of 8 weeks for adults with an immunocompromising condition,* cochlear implant, or cerebrospinal fluid leak). Previously received only PCV7: follow the recommendation above. Previously received only PCV13: 1 dose PCV20 or 1 dose PCV21 at least 1 year after the last PCV13 dose. Previously received only PPSV23: 1 dose PCV15 or 1 dose PCV20 or PCV21, at least 1 year after the last PPSV23 dose. If PCV15 is used, no additional PPSV23 doses are recommended.  Previously received PCV13 and 1 dose of PPSV23: Cochlear implant, cerebrospinal fluid leak, or an immunocompromising condition*: 1 dose PCV20 or 1 dose PCV21 at least 5 years after the last pneumococcal vaccine dose. Alcoholism, chronic heart/liver/lung disease, cigarette smoking, or diabetes mellitus: no  additional PCV or PPSV23 doses recommended at this time. Review pneumococcal recommendations when age 17 years or older. Adults aged 88 years and older who have received PCV20 or PCV21: no additional pneumococcal vaccine dose recommended. Pregnancy: no recommendation for PCV or PPSV23 due to limited data. Summary of existing data on pneumococcal vaccination during pregnancy can be found at TanningAlert.tn. PPSV23 not available: adults aged 22 years or older who received PCV15 but have not yet completed PPSV23 series, can complete the series with either 1 dose of  PCV20 or 1 dose of PCV21 if they no longer have access to PPSV23.  For guidance on determining which pneumococcal vaccines a patient needs and when, please refer to the mobile app which can be downloaded here: http://henry.com/.  *Note: Immunocompromising conditions include chronic renal failure, nephrotic syndrome, immunodeficiencies, iatrogenic immunosuppression, generalized malignancy, HIV infection, Hodgkin disease, leukemia, lymphoma, multiple myeloma, solid organ transplant, congenital or acquired asplenia, or sickle cell disease or other hemoglobinopathies.  **Note: Underlying medical conditions or other risk factors include alcoholism, chronic heart/liver/lung disease, chronic renal failure, cigarette smoking, cochlear implant, congenital or acquired asplenia, CSF leak, diabetes mellitus, generalized malignancy, HIV infection, Hodgkin disease, immunodeficiencies, iatrogenic immunosuppression, leukemia, lymphoma, multiple myeloma, nephrotic syndrome, solid organ transplant, or sickle cell disease or other hemoglobinopathies.

## 2024-04-01 NOTE — Progress Notes (Signed)
 Subjective:   Selena Rogers Apr 05, 1996  04/01/2024   CC: Chief Complaint  Patient presents with   Annual Exam   HPI: Selena Rogers is a 28 y.o. female who presents for a routine health maintenance exam.  Labs collected at time of visit.  Family history of hypercholesterolemia.  Focusing on exercising and healthy eating.  She was premature at 24 weeks- recommend pneumonia vaccine.   HEALTH SCREENINGS: - Vision Screening: up to date, about 1 year ago- no vision changes  - Dental Visits: q6 months for cleanings - Pap smear: up to date - Breast Exam: up to date - STD Screening: Declined - Mammogram (40+): Not applicable  - Colonoscopy (45+): Not applicable  - Bone Density (65+ or under 65 with predisposing conditions): Not applicable  - Lung CA screening with low-dose CT:  Not applicable Adults age 77-80 who are current cigarette smokers or quit within the last 15 years. Must have 20 pack year history.   Depression and Anxiety Screen done today and results listed below:     04/01/2024    2:06 PM 03/03/2024    2:19 PM 04/29/2021    2:09 PM 01/31/2021   10:05 AM 01/29/2020   10:56 AM  Depression screen PHQ 2/9  Decreased Interest 0 0 0 0 0  Down, Depressed, Hopeless 0 0 0 0 0  PHQ - 2 Score 0 0 0 0 0  Altered sleeping 2 3  0 0  Tired, decreased energy 2 2  0 1  Change in appetite 0 2  0 0  Feeling bad or failure about yourself  0 0  0 0  Trouble concentrating 0 2  0 0  Moving slowly or fidgety/restless 0 0  0 0  Suicidal thoughts 0 0  0 0  PHQ-9 Score 4 9  0 1  Difficult doing work/chores Not difficult at all Somewhat difficult   Not difficult at all      04/01/2024    2:07 PM 03/03/2024    2:19 PM 01/31/2021   10:05 AM 01/29/2020   10:57 AM  GAD 7 : Generalized Anxiety Score  Nervous, Anxious, on Edge 0 0 0 0  Control/stop worrying 0 2 0 0  Worry too much - different things 0 2 0 0  Trouble relaxing 2 3 0 0  Restless 0 0 0 0  Easily annoyed  or irritable 0 0 0 0  Afraid - awful might happen 0 0 0 0  Total GAD 7 Score 2 7 0 0  Anxiety Difficulty Not difficult at all Somewhat difficult     IMMUNIZATIONS: - Tdap: Tetanus vaccination status reviewed: last tetanus booster within 10 years. 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   Past Medical History:  Diagnosis Date   Anxiety    Asthma    Depression    Genital herpes    Seasonal allergies     Past Surgical History:  Procedure Laterality Date   SKIN GRAFT Left 1999   lower leg   TONSILLECTOMY     TOOTH EXTRACTION      Current Outpatient Medications on File Prior to Visit  Medication Sig   albuterol  (VENTOLIN  HFA) 108 (  90 Base) MCG/ACT inhaler Inhale 1-2 puffs into the lungs every 4 (four) hours as needed.   fluticasone (FLONASE) 50 MCG/ACT nasal spray 1 spray by Each Nare route daily.   sertraline  (ZOLOFT ) 50 MG tablet TAKE 1 TABLET BY MOUTH EVERY DAY   No current facility-administered medications on file prior to visit.    Allergies  Allergen Reactions   Clindamycin/Lincomycin Rash   Zithromax [Azithromycin] Rash     Social History   Socioeconomic History   Marital status: Divorced    Spouse name: Not on file   Number of children: Not on file   Years of education: Not on file   Highest education level: Not on file  Occupational History   Not on file  Tobacco Use   Smoking status: Never    Passive exposure: Never   Smokeless tobacco: Never  Vaping Use   Vaping status: Never Used  Substance and Sexual Activity   Alcohol use: No   Drug use: No   Sexual activity: Not Currently    Birth control/protection: None  Other Topics Concern   Not on file   Social History Narrative   Not on file   Social Drivers of Health   Financial Resource Strain: Not on file  Food Insecurity: Not on file  Transportation Needs: Not on file  Physical Activity: Not on file  Stress: Not on file  Social Connections: Not on file  Intimate Partner Violence: Not on file   Social History   Tobacco Use  Smoking Status Never   Passive exposure: Never  Smokeless Tobacco Never   Social History   Substance and Sexual Activity  Alcohol Use No    Family History  Problem Relation Age of Onset   Stroke Mother    Healthy Father    Seizures Brother    Breast cancer Neg Hx    Ovarian cancer Neg Hx    Colon cancer Neg Hx      ROS: Denies fever, fatigue, unexplained weight loss/gain, chest pain, SHOB, and palpitations. Denies neurological deficits, gastrointestinal or genitourinary complaints, and skin changes.   Objective:   Today's Vitals   04/01/24 1402  BP: 109/70  Pulse: 72  Resp: 18  Temp: 97.8 F (36.6 C)  TempSrc: Oral  SpO2: 98%  Weight: 150 lb (68 kg)  Height: 5\' 1"  (1.549 m)  PainSc: 0-No pain    GENERAL APPEARANCE: Well-appearing, in NAD. Well nourished.  SKIN: Pink, warm and dry. Turgor normal. No rash, lesion, ulceration, or ecchymoses. Hair evenly distributed.  HEENT: HEAD: Normocephalic.  EYES: PERRLA. EOMI. Lids intact w/o defect. Sclera white, Conjunctiva pink w/o exudate.  EARS: External ear w/o redness, swelling, masses or lesions. EAC clear. TM's intact, translucent w/o bulging, appropriate landmarks visualized. Appropriate acuity to conversational tones.  NOSE: Septum midline w/o deformity. Nares patent, mucosa pink and non-inflamed w/o drainage. No sinus tenderness.  THROAT: Uvula midline. Oropharynx clear. Tonsils non-inflamed w/o exudate. Oral mucosa pink and moist.  NECK: Supple, Trachea midline. Full ROM w/o pain or tenderness. No lymphadenopathy. Thyroid non-tender w/o enlargement or palpable masses.  BREASTS:  Deferred.  RESPIRATORY: Chest wall symmetrical w/o masses. Respirations even and non-labored. Breath sounds clear to auscultation bilaterally. No wheezes, rales, rhonchi, or crackles. CARDIAC: S1, S2 present, regular rate and rhythm. No gallops, murmurs, rubs, or clicks. PMI w/o lifts, heaves, or thrills. No carotid bruits. Capillary refill <2 seconds. Peripheral pulses 2+ bilaterally. GI: Abdomen soft w/o distention. Normoactive bowel sounds. No palpable masses or tenderness.  No guarding or rebound tenderness. Liver and spleen w/o tenderness or enlargement. No CVA tenderness.  GU: Deferred.  MSK: Muscle tone and strength appropriate for age, w/o atrophy or abnormal movement.  EXTREMITIES: Active ROM intact, w/o tenderness, crepitus, or contracture. No obvious joint deformities or effusions. No clubbing, edema, or cyanosis.  NEUROLOGIC: CN's II-XII intact. Motor strength symmetrical with no obvious weakness. No sensory deficits. DTR's 2+ symmetric bilaterally. Steady, even gait.  PSYCH/MENTAL STATUS: Alert, oriented x 3. Cooperative, appropriate mood and affect.   Results for orders placed or performed in visit on 03/27/24  HSV 1 and 2 Ab, IgG   Collection Time: 03/27/24 11:10 AM  Result Value Ref Range   HSV 1 Glycoprotein G Ab, IgG Reactive (A) Non Reactive   HSV 2 IgG, Type Spec Non Reactive Non Reactive    Assessment & Plan:   1. Wellness examination (Primary) - Encouraged a healthy well-balanced diet. Patient may adjust caloric intake to maintain or achieve ideal body weight. May reduce intake of dietary saturated fat and total fat and have adequate dietary potassium and calcium preferably from fresh fruits, vegetables, and low-fat dairy products.   - Advised to avoid cigarette smoking. - Discussed with the patient that most people either abstain from alcohol or drink within safe limits (<=14/week and <=4 drinks/occasion for males, <=7/weeks and <= 3 drinks/occasion for females) and that  the risk for alcohol disorders and other health effects rises proportionally with the number of drinks per week and how often a drinker exceeds daily limits. - Discussed cessation/primary prevention of drug use and availability of treatment for abuse.  - Discussed sexually transmitted diseases, avoidance of unintended pregnancy and contraceptive alternatives. - Stressed the importance of regular exercise - Injury prevention: Discussed safety belts, safety helmets, smoke detector, smoking near bedding or upholstery.  - Dental health: Discussed importance of regular tooth brushing, flossing, and dental visits.  NEXT PREVENTATIVE PHYSICAL DUE IN 1 YEAR.  2. Elevated LDL cholesterol level Reviewed lipid panel with patient: total chol 187, triglycerides 57, HDL 52 and LDL 124. Discussed healthy diet (including lean meats, fruits, vegetables) and daily exercise. Plan to repeat in 6 months.  - Lipid panel; Future - Lipoprotein A (LPA); Future  3. Family history of hyperlipidemia See #2 - Lipid panel; Future - Lipoprotein A (LPA); Future   Return in about 6 months (around 10/02/2024) for lab visit for fasting lipid panel & CPE in 1 year w Pap .  Patient to reach out to office if new, worrisome, or unresolved symptoms arise or if no improvement in patient's condition. Patient verbalized understanding and is agreeable to treatment plan. All questions answered to patient's satisfaction.   Wilhelmena Hanson, FNP

## 2024-08-04 ENCOUNTER — Telehealth: Payer: Self-pay

## 2024-08-04 NOTE — Telephone Encounter (Signed)
 Please call patient to schedule needed appointments:   Copied from CRM [id number]. Topic: Appointments - Appointment Cancel/Reschedule >> Aug 04, 2024  3:07 PM Montie POUR wrote: Patient/patient representative is calling to cancel or reschedule an appointment. Refer to attachments for appointment information.  Selena Rogers needs to reschedule her appointment for 09/30/24. Her insurance will run out on 09/05/24 and she needs to come in before this date. Epic would not let me reschedule.  Please call her at [phone] to reschedule. She can come in any Tuesday. Thanks

## 2024-08-07 ENCOUNTER — Telehealth: Payer: Self-pay

## 2024-08-07 NOTE — Telephone Encounter (Signed)
 Copied from CRM [id number]. Topic: Appointments - Appointment Scheduling >> Aug 07, 2024 12:26 PM Dedra B wrote: Patient/patient representative is calling to schedule an appointment.  Lab options do not come up when trying to schedule. Pls call pt.

## 2024-08-11 ENCOUNTER — Ambulatory Visit

## 2024-08-11 ENCOUNTER — Other Ambulatory Visit: Payer: Self-pay

## 2024-08-11 DIAGNOSIS — E7800 Pure hypercholesterolemia, unspecified: Secondary | ICD-10-CM

## 2024-08-11 DIAGNOSIS — Z83438 Family history of other disorder of lipoprotein metabolism and other lipidemia: Secondary | ICD-10-CM

## 2024-08-12 LAB — LIPOPROTEIN A (LPA): Lipoprotein (a): 11.4 nmol/L (ref <75.0)

## 2024-08-12 LAB — LIPID PANEL
Chol/HDL Ratio: 4.2 ratio (ref 0.0–4.4)
Cholesterol, Total: 206 mg/dL — ABNORMAL HIGH (ref 100–199)
HDL: 49 mg/dL (ref >39)
LDL Chol Calc (NIH): 145 mg/dL — ABNORMAL HIGH (ref 0–99)
Triglycerides: 69 mg/dL (ref 0–149)
VLDL Cholesterol Cal: 12 mg/dL (ref 5–40)

## 2024-08-14 ENCOUNTER — Ambulatory Visit: Payer: Self-pay | Admitting: Family Medicine

## 2024-08-18 ENCOUNTER — Ambulatory Visit: Admitting: Family Medicine

## 2024-08-25 ENCOUNTER — Ambulatory Visit: Admitting: Family Medicine

## 2024-09-01 ENCOUNTER — Ambulatory Visit: Admitting: Family Medicine

## 2024-09-05 ENCOUNTER — Encounter: Payer: Self-pay | Admitting: Family Medicine

## 2024-09-05 DIAGNOSIS — J3089 Other allergic rhinitis: Secondary | ICD-10-CM

## 2024-09-05 DIAGNOSIS — J4520 Mild intermittent asthma, uncomplicated: Secondary | ICD-10-CM

## 2024-09-05 MED ORDER — ALBUTEROL SULFATE HFA 108 (90 BASE) MCG/ACT IN AERS: 1.0000 | INHALATION_SPRAY | RESPIRATORY_TRACT | 3 refills | Status: AC | PRN

## 2024-09-05 MED ORDER — FLUTICASONE PROPIONATE 50 MCG/ACT NA SUSP: 1.0000 | Freq: Every day | NASAL | 3 refills | Status: AC

## 2024-09-18 DIAGNOSIS — Z23 Encounter for immunization: Secondary | ICD-10-CM | POA: Diagnosis not present

## 2024-09-30 ENCOUNTER — Ambulatory Visit

## 2025-04-01 ENCOUNTER — Ambulatory Visit: Admitting: Family Medicine
